# Patient Record
Sex: Male | Born: 1960
Health system: Southern US, Community
[De-identification: ages and names within clinical notes are randomized; demographics above are authoritative.]

## PROBLEM LIST (undated history)

## (undated) DIAGNOSIS — R12 Heartburn: Secondary | ICD-10-CM

## (undated) DIAGNOSIS — N200 Calculus of kidney: Secondary | ICD-10-CM

## (undated) DIAGNOSIS — Z87442 Personal history of urinary calculi: Secondary | ICD-10-CM

## (undated) DIAGNOSIS — E785 Hyperlipidemia, unspecified: Secondary | ICD-10-CM

## (undated) DIAGNOSIS — I10 Essential (primary) hypertension: Secondary | ICD-10-CM

## (undated) DIAGNOSIS — Z803 Family history of malignant neoplasm of breast: Secondary | ICD-10-CM

## (undated) DIAGNOSIS — K4000 Bilateral inguinal hernia, with obstruction, without gangrene, not specified as recurrent: Secondary | ICD-10-CM

## (undated) HISTORY — DX: Family history of malignant neoplasm of breast: Z80.3

## (undated) HISTORY — PX: JOINT REPLACEMENT: SHX530

## (undated) HISTORY — PX: OTHER SURGICAL HISTORY: SHX169

## (undated) HISTORY — DX: Hyperlipidemia, unspecified: E78.5

## (undated) HISTORY — DX: Calculus of kidney: N20.0

## (undated) HISTORY — PX: EYE SURGERY: SHX253

## (undated) HISTORY — DX: Heartburn: R12

## (undated) HISTORY — PX: CYST REMOVAL TRUNK: SHX6283

## (undated) HISTORY — PX: TOTAL KNEE ARTHROPLASTY: SHX125

---

## 2011-10-26 ENCOUNTER — Emergency Department: Payer: Self-pay | Admitting: Emergency Medicine

## 2011-10-26 LAB — URINALYSIS, COMPLETE
Bilirubin,UR: NEGATIVE
Glucose,UR: NEGATIVE mg/dL (ref 0–75)
Ketone: NEGATIVE
RBC,UR: NONE SEEN /HPF (ref 0–5)
Specific Gravity: 1.008 (ref 1.003–1.030)
Squamous Epithelial: NONE SEEN

## 2011-10-26 LAB — CBC
HCT: 46.9 % (ref 40.0–52.0)
MCH: 30.4 pg (ref 26.0–34.0)
MCHC: 32.7 g/dL (ref 32.0–36.0)
MCV: 93 fL (ref 80–100)
RBC: 5.04 10*6/uL (ref 4.40–5.90)
RDW: 13.8 % (ref 11.5–14.5)

## 2011-10-26 LAB — BASIC METABOLIC PANEL
Anion Gap: 3 — ABNORMAL LOW (ref 7–16)
BUN: 17 mg/dL (ref 7–18)
Calcium, Total: 9.4 mg/dL (ref 8.5–10.1)
EGFR (African American): >60
EGFR (Non-African Amer.): >60
Glucose: 93 mg/dL (ref 65–99)
Osmolality: 281 (ref 275–301)

## 2012-04-20 ENCOUNTER — Emergency Department: Payer: Self-pay | Admitting: Emergency Medicine

## 2012-06-01 ENCOUNTER — Ambulatory Visit: Payer: Self-pay | Admitting: Internal Medicine

## 2012-06-06 ENCOUNTER — Ambulatory Visit: Payer: Self-pay | Admitting: Internal Medicine

## 2012-12-21 ENCOUNTER — Ambulatory Visit: Payer: Self-pay | Admitting: Specialist

## 2012-12-24 ENCOUNTER — Ambulatory Visit: Payer: Self-pay | Admitting: Orthopedic Surgery

## 2014-02-11 ENCOUNTER — Emergency Department: Payer: Self-pay | Admitting: Emergency Medicine

## 2014-02-13 LAB — BETA STREP CULTURE(ARMC)

## 2015-11-10 ENCOUNTER — Emergency Department
Admission: EM | Admit: 2015-11-10 | Discharge: 2015-11-10 | Disposition: A | Discharge status: Home / Self Care | Payer: BLUE CROSS/BLUE SHIELD | Attending: Student in an Organized Health Care Education/Training Program | Admitting: Student in an Organized Health Care Education/Training Program

## 2015-11-10 ENCOUNTER — Emergency Department: Payer: BLUE CROSS/BLUE SHIELD

## 2015-11-10 DIAGNOSIS — Z79899 Other long term (current) drug therapy: Secondary | ICD-10-CM | POA: Insufficient documentation

## 2015-11-10 DIAGNOSIS — R072 Precordial pain: Secondary | ICD-10-CM | POA: Diagnosis not present

## 2015-11-10 DIAGNOSIS — I10 Essential (primary) hypertension: Secondary | ICD-10-CM | POA: Diagnosis not present

## 2015-11-10 DIAGNOSIS — R079 Chest pain, unspecified: Secondary | ICD-10-CM

## 2015-11-10 HISTORY — DX: Essential (primary) hypertension: I10

## 2015-11-10 LAB — BASIC METABOLIC PANEL
Anion gap: 6 (ref 5–15)
BUN: 18 mg/dL (ref 6–20)
CHLORIDE: 106 mmol/L (ref 101–111)
CO2: 26 mmol/L (ref 22–32)
CREATININE: 1.12 mg/dL (ref 0.61–1.24)
Calcium: 9.6 mg/dL (ref 8.9–10.3)
GFR calc Af Amer: >60 mL/min (ref >60)
GFR calc non Af Amer: >60 mL/min (ref >60)
GLUCOSE: 116 mg/dL — AB (ref 65–99)
POTASSIUM: 3.5 mmol/L (ref 3.5–5.1)
SODIUM: 138 mmol/L (ref 135–145)

## 2015-11-10 LAB — TROPONIN I
Troponin I: <0.03 ng/mL (ref <0.03)
Troponin I: <0.03 ng/mL (ref <0.03)

## 2015-11-10 LAB — CBC
HEMATOCRIT: 44.4 % (ref 40.0–52.0)
Hemoglobin: 15.8 g/dL (ref 13.0–18.0)
MCH: 32 pg (ref 26.0–34.0)
MCHC: 35.6 g/dL (ref 32.0–36.0)
MCV: 90 fL (ref 80.0–100.0)
PLATELETS: 223 10*3/uL (ref 150–440)
RBC: 4.93 MIL/uL (ref 4.40–5.90)
RDW: 14.2 % (ref 11.5–14.5)
WBC: 8.3 10*3/uL (ref 3.8–10.6)

## 2015-11-10 LAB — FIBRIN DERIVATIVES D-DIMER (ARMC ONLY): Fibrin derivatives D-dimer (ARMC): 201 (ref 0–499)

## 2015-11-10 MED ORDER — ALBUTEROL SULFATE (2.5 MG/3ML) 0.083% IN NEBU
2.5000 mg | INHALATION_SOLUTION | Freq: Once | RESPIRATORY_TRACT | Status: AC
  Administered 2015-11-10: 2.5 mg via RESPIRATORY_TRACT
  Filled 2015-11-10: qty 3

## 2015-11-10 MED ORDER — HYDROCODONE-ACETAMINOPHEN 5-325 MG PO TABS
1.0000 | ORAL_TABLET | Freq: Once | ORAL | Status: AC
  Administered 2015-11-10: 1 via ORAL
  Filled 2015-11-10: qty 1

## 2015-11-10 MED ORDER — NAPROXEN 500 MG PO TABS: 500.0000 mg | ORAL_TABLET | Freq: Two times a day (BID) | ORAL | 0 refills | Status: DC

## 2015-11-10 MED ORDER — ALBUTEROL SULFATE HFA 108 (90 BASE) MCG/ACT IN AERS: 2.0000 | INHALATION_SPRAY | Freq: Four times a day (QID) | RESPIRATORY_TRACT | 2 refills | Status: DC | PRN

## 2015-11-10 NOTE — ED Provider Notes (Signed)
[facility] Emergency Department Provider Note    None    (approximate)  I have reviewed the triage vital signs and the nursing notes.   HISTORY  Chief Complaint Chest Pain    HPI Stanley Whitney is a 55 y.o. male with history of high blood pressure presents to emergency Department with 5 days of intermittent chest pain and shortness of breath. Patient denies any history of coronary disease. Does have family history of heart disease. He denies any history of high cholesterol or diabetes. States that when he takes a deep breath he does have some midsternal discomfort without radiation to his shoulder or back. He currently states the pain is 2 out of 10 in severity when taking a deep breath. Denies any cough or fevers. Denies any nausea or vomiting. He does not smoke. This is fairly active at work and has had worsening muscle cramps over the past several weeks. States that his chest currently feels like a muscle cramp.   Past Medical History:  Diagnosis Date  . Hypertension     There are no active problems to display for this patient.   History reviewed. No pertinent surgical history.  Prior to Admission medications   Medication Sig Start Date End Date Taking? Authorizing Provider  atenolol (TENORMIN) 50 MG tablet Take 50 mg by mouth daily.   Yes Historical Provider, MD  esomeprazole (NEXIUM) 40 MG capsule Take 40 mg by mouth every evening.  11/07/15  Yes Historical Provider, MD  hydrochlorothiazide (HYDRODIURIL) 12.5 MG tablet Take 12.5 mg by mouth daily.  10/24/15  Yes Historical Provider, MD  naproxen sodium (ANAPROX) 220 MG tablet Take 440 mg by mouth 2 (two) times daily with a meal.   Yes Historical Provider, MD  Omega-3 Fatty Acids (FISH OIL) 1000 MG CAPS Take 2 capsules by mouth daily.   Yes Historical Provider, MD  albuterol (PROVENTIL HFA;VENTOLIN HFA) 108 (90 Base) MCG/ACT inhaler Inhale 2 puffs into the lungs every 6 (six) hours as needed for  wheezing or shortness of breath. 11/10/15   Merlyn Lot, MD  naproxen (NAPROSYN) 500 MG tablet Take 1 tablet (500 mg total) by mouth 2 (two) times daily with a meal. 11/10/15 11/09/16  Merlyn Lot, MD    Allergies Review of patient's allergies indicates no known allergies.  No family history on file.  Social History Social History  Substance Use Topics  . Smoking status: Never Smoker  . Smokeless tobacco: Never Used  . Alcohol use No    Review of Systems Patient denies headaches, rhinorrhea, blurry vision, numbness, shortness of breath, chest pain, edema, cough, abdominal pain, nausea, vomiting, diarrhea, dysuria, fevers, rashes or hallucinations unless otherwise stated above in HPI. ____________________________________________   PHYSICAL EXAM:  VITAL SIGNS: Vitals:   11/10/15 1530 11/10/15 1600  BP: 124/86 (!) 144/92  Pulse: 66 66  Resp: 12 13  Temp:      Constitutional: Alert and oriented. Well appearing and in no acute distress. Eyes: Conjunctivae are normal. PERRL. EOMI. Head: Atraumatic. Nose: No congestion/rhinnorhea. Mouth/Throat: Mucous membranes are moist.  Oropharynx non-erythematous. Neck: No stridor. Painless ROM. No cervical spine tenderness to palpation Hematological/Lymphatic/Immunilogical: No cervical lymphadenopathy. Cardiovascular: Normal rate, regular rhythm. Grossly normal heart sounds.  Good peripheral circulation. Respiratory: Normal respiratory effort.  No retractions. Lungs CTAB. Gastrointestinal: Soft and nontender. No distention. No abdominal bruits. No CVA tenderness. Genitourinary:  Musculoskeletal: No lower extremity tenderness nor edema.  No joint effusions. Neurologic:  Normal speech and language. No  gross focal neurologic deficits are appreciated. No gait instability. Skin:  Skin is warm, dry and intact. No rash noted. Psychiatric: Mood and affect are normal. Speech and behavior are  normal.  ____________________________________________   LABS (all labs ordered are listed, but only abnormal results are displayed)  Results for orders placed or performed during the hospital encounter of 11/10/15 (from the past 24 hour(s))  Basic metabolic panel     Status: Abnormal   Collection Time: 11/10/15 10:38 AM  Result Value Ref Range   Sodium 138 135 - 145 mmol/L   Potassium 3.5 3.5 - 5.1 mmol/L   Chloride 106 101 - 111 mmol/L   CO2 26 22 - 32 mmol/L   Glucose, Bld 116 (H) 65 - 99 mg/dL   BUN 18 6 - 20 mg/dL   Creatinine, Ser 1.12 0.61 - 1.24 mg/dL   Calcium 9.6 8.9 - 10.3 mg/dL   GFR calc non Af Amer >60 >60 mL/min   GFR calc Af Amer >60 >60 mL/min   Anion gap 6 5 - 15  CBC     Status: None   Collection Time: 11/10/15 10:38 AM  Result Value Ref Range   WBC 8.3 3.8 - 10.6 K/uL   RBC 4.93 4.40 - 5.90 MIL/uL   Hemoglobin 15.8 13.0 - 18.0 g/dL   HCT 44.4 40.0 - 52.0 %   MCV 90.0 80.0 - 100.0 fL   MCH 32.0 26.0 - 34.0 pg   MCHC 35.6 32.0 - 36.0 g/dL   RDW 14.2 11.5 - 14.5 %   Platelets 223 150 - 440 K/uL  Troponin I     Status: None   Collection Time: 11/10/15 10:38 AM  Result Value Ref Range   Troponin I <0.03 <0.03 ng/mL  Fibrin derivatives D-Dimer ([facility] only)     Status: None   Collection Time: 11/10/15  3:13 PM  Result Value Ref Range   Fibrin derivatives D-dimer ([facility]) 201 0 - 499  Troponin I     Status: None   Collection Time: 11/10/15  3:13 PM  Result Value Ref Range   Troponin I <0.03 <0.03 ng/mL   ____________________________________________  EKG  Time: 10:30  Indication: chest pain  Rate: 75  Rhythm: NSR Axis: normal Other: poor r wave progression, no acute ischemic changes ____________________________________________  RADIOLOGY  CXR  my read shows no evidence of acute cardiopulmonary process.  ____________________________________________   PROCEDURES  Procedure(s) performed: none    Critical Care performed:  no ____________________________________________   INITIAL IMPRESSION / ASSESSMENT AND PLAN / ED COURSE  Pertinent labs & imaging results that were available during my care of the patient were reviewed by me and considered in my medical decision making (see chart for details).  DDX: ACS, PE, dissection, pericarditis, costochondritis, pleurisy, bronchitis, pneumonia  Stanley Whitney is a 55 y.o. who presents to the ED with 5 days of intermittent chest pain and shortness of breath. Initial EKG and troponin performed in triage and negative for acute ischemia. Presentation is not consistent with dissection as he is equal pulses bilaterally no tearing pain through to his chest. Chest x-ray ordered due to concern for heart failure or pneumonia shows none. Pain is reproducible with palpation. Less consistent with pericarditis. Will provide albuterol treatment for possible bronchitis.  Patient has a low risk heart score of 3. Based on his pleuritic chest pain or d-dimer to further risk stratify for PE as he is otherwise low risk [place].  Clinical Course  Comment By  Time  Patient reassessed and currently chest pain-free. Results of testing and that we are still awaiting results of d-dimer. Merlyn Lot, MD 08/08 1615  D-dimer is negative do not feel this presentation clinically consistent with PE.  Patient is well-appearing chest pain-free. The patient is stable for outpatient follow-up with PCP.Marland Kitchen Merlyn Lot, MD 08/08 [phone]    Have discussed with the patient and available family all diagnostics and treatments performed thus far and all questions were answered to the best of my ability. The patient demonstrates understanding and agreement with plan.  ____________________________________________   FINAL CLINICAL IMPRESSION(S) / ED DIAGNOSES  Final diagnoses:  Chest pain, unspecified chest pain type      NEW MEDICATIONS STARTED DURING THIS VISIT:  New Prescriptions   ALBUTEROL (PROVENTIL  HFA;VENTOLIN HFA) 108 (90 BASE) MCG/ACT INHALER    Inhale 2 puffs into the lungs every 6 (six) hours as needed for wheezing or shortness of breath.   NAPROXEN (NAPROSYN) 500 MG TABLET    Take 1 tablet (500 mg total) by mouth 2 (two) times daily with a meal.     Note:  This document was prepared using Dragon voice recognition software and may include unintentional dictation errors.    Merlyn Lot, MD 11/10/15 [phone]

## 2015-11-10 NOTE — ED Triage Notes (Signed)
Pt c/o intermittent substernal  Chest pain for the past 4-5 days with muscle cramping with visual changes.the patient is in NAD at present.Stanley Whitney

## 2016-01-04 ENCOUNTER — Ambulatory Visit (INDEPENDENT_AMBULATORY_CARE_PROVIDER_SITE_OTHER): Payer: BLUE CROSS/BLUE SHIELD | Admitting: Urology

## 2016-01-04 ENCOUNTER — Encounter: Payer: Self-pay | Admitting: Urology

## 2016-01-04 ENCOUNTER — Other Ambulatory Visit: Payer: Self-pay

## 2016-01-04 VITALS — BP 150/89 | HR 70 | Ht 68.0 in | Wt 201.8 lb

## 2016-01-04 DIAGNOSIS — K439 Ventral hernia without obstruction or gangrene: Secondary | ICD-10-CM | POA: Diagnosis not present

## 2016-01-04 DIAGNOSIS — R8290 Unspecified abnormal findings in urine: Secondary | ICD-10-CM

## 2016-01-04 DIAGNOSIS — R3129 Other microscopic hematuria: Secondary | ICD-10-CM

## 2016-01-04 LAB — BLADDER SCAN AMB NON-IMAGING: Scan Result: 15

## 2016-01-04 NOTE — Progress Notes (Signed)
01/04/2016 4:42 PM   Stanley Whitney [birth year] [id number]  Referring provider: Jodi Marble, MD [address] [place], [place] [address]  Chief Complaint  Patient presents with  . Hematuria    referred by S. Ahmed Elijio Miles    HPI: Patient is a 55 -year-old Caucasian male who presents today as a referral from their PCP, Dr. Elijio Miles, for microscopic hematuria.  Patient was found to have trace blood on a POCT urine dip on 12/14/2015.  Patient doesn't have a prior history of microscopic hematuria.    He does have a possible history of nephrolithiasis, but he does not have a prior history of recurrent urinary tract infections, trauma to the genitourinary tract, BPH or malignancies of the genitourinary tract.   He does not have a family medical history of nephrolithiasis, malignancies of the genitourinary tract or hematuria.   Today, he is not having symptoms of frequent urination, urgency, dysuria, nocturia, incontinence, hesitancy, intermittency, straining to urinate or a weak urinary stream.  His UA today does not demonstrate microscopic hematuria.    He is experiencing abdominal pain due to two ventral hernias.  Patient is under the impression that he is here for the evaluation of the hernias.    He is not experiencing any suprapubic pain or flank pain. He denies any recent fevers, chills, nausea or vomiting.  His PVR today is 15 mL.    He is a former smoker, with a 1 ppd history.  Quit 30 years ago.  He does dip.  He is not exposed to secondhand smoke.  He has worked with [employer].    PMH: Past Medical History:  Diagnosis Date  . Heartburn   . HLD (hyperlipidemia)   . Hypertension   . Kidney stone     Surgical History: Past Surgical History:  Procedure Laterality Date  . none      Home Medications:    Medication List       Accurate as of 01/04/16  4:42 PM. Always use your most recent med list.          albuterol 108 (90 Base) MCG/ACT  inhaler Commonly known as:  PROVENTIL HFA;VENTOLIN HFA Inhale 2 puffs into the lungs every 6 (six) hours as needed for wheezing or shortness of breath.   atenolol 50 MG tablet Commonly known as:  TENORMIN Take 50 mg by mouth daily.   atenolol-chlorthalidone 50-25 MG tablet Commonly known as:  TENORETIC   DOCOSAHEXAENOIC ACID PO Take by mouth.   esomeprazole 40 MG capsule Commonly known as:  NEXIUM Take 40 mg by mouth every evening.   Fish Oil 1000 MG Caps Take 2 capsules by mouth daily.   hydrochlorothiazide 12.5 MG tablet Commonly known as:  HYDRODIURIL Take 12.5 mg by mouth daily.   losartan 25 MG tablet Commonly known as:  COZAAR Take 25 mg by mouth.   naproxen 500 MG tablet Commonly known as:  NAPROSYN Take 1 tablet (500 mg total) by mouth 2 (two) times daily with a meal.   naproxen sodium 220 MG tablet Commonly known as:  ANAPROX Take 440 mg by mouth 2 (two) times daily with a meal.   rosuvastatin 5 MG tablet Commonly known as:  CRESTOR Take 5 mg by mouth daily.       Allergies: No Known Allergies  Family History: Family History  Problem Relation Age of Onset  . Kidney disease Neg Hx   . Prostate cancer Neg Hx     Social History:  reports that he has quit smoking. He has never used smokeless tobacco. He reports that he drinks alcohol. He reports that he does not use drugs.  ROS: UROLOGY Frequent Urination?: No Hard to postpone urination?: No Burning/pain with urination?: No Get up at night to urinate?: No Leakage of urine?: No Urine stream starts and stops?: No Trouble starting stream?: No Do you have to strain to urinate?: No Blood in urine?: No Urinary tract infection?: No Sexually transmitted disease?: No Injury to kidneys or bladder?: No Painful intercourse?: No Weak stream?: No Erection problems?: Yes Penile pain?: No  Gastrointestinal Nausea?: No Vomiting?: No Indigestion/heartburn?: No Diarrhea?: No Constipation?:  No  Constitutional Fever: No Night sweats?: No Weight loss?: No Fatigue?: No  Skin Skin rash/lesions?: No Itching?: No  Eyes Blurred vision?: No Double vision?: No  Ears/Nose/Throat Sore throat?: No Sinus problems?: No  Hematologic/Lymphatic Swollen glands?: No Easy bruising?: No  Cardiovascular Leg swelling?: No Chest pain?: No  Respiratory Cough?: No Shortness of breath?: No  Endocrine Excessive thirst?: No  Musculoskeletal Back pain?: Yes Joint pain?: Yes  Neurological Headaches?: No Dizziness?: No  Psychologic Depression?: No Anxiety?: No  Physical Exam: BP (!) 150/89   Pulse 70   Ht 5\' 8"  (1.727 m)   Wt 201 lb 12.8 oz (91.5 kg)   BMI 30.68 kg/m   Constitutional: Well nourished. Alert and oriented, No acute distress. HEENT: [place] AT, moist mucus membranes. Trachea midline, no masses. Cardiovascular: No clubbing, cyanosis, or edema. Respiratory: Normal respiratory effort, no increased work of breathing. GI: Abdomen is soft, non tender, non distended, no abdominal masses. Liver and spleen not palpable.  No hernias appreciated.  Stool sample for occult testing is not indicated.   GU: No CVA tenderness.  No bladder fullness or masses.  Patient with circumcised phallus.  Urethral meatus is patent.  No penile discharge. No penile lesions or rashes. Scrotum without lesions, cysts, rashes and/or edema.  Testicles are located scrotally bilaterally. No masses are appreciated in the testicles. Left and right epididymis are normal. Rectal: Patient with  normal sphincter tone. Anus and perineum without scarring or rashes. No rectal masses are appreciated. Prostate is approximately 50 grams, no nodules are appreciated. Seminal vesicles are normal. Skin: No rashes, bruises or suspicious lesions. Lymph: No cervical or inguinal adenopathy. Neurologic: Grossly intact, no focal deficits, moving all 4 extremities. Psychiatric: Normal mood and affect.  Laboratory  Data: Lab Results  Component Value Date   WBC 8.3 11/10/2015   HGB 15.8 11/10/2015   HCT 44.4 11/10/2015   MCV 90.0 11/10/2015   PLT 223 11/10/2015    Lab Results  Component Value Date   CREATININE 1.12 11/10/2015   PSA History  1.0 ng/mL on 12/14/2015  Urinalysis Unremarkable.  See EPIC.  Pertinent Imaging: Results for ATILANO, KRANTZ (MRN [id number]) as of 01/10/2016 17:12  Ref. Range 01/04/2016 16:44  Scan Result Unknown 15    Assessment & Plan:    1. Abnormal urine dip  - At this time patient does not meet the requirements for a hematuria workup, he does not endorse gross hematuria and I do not have microscopic urinalysis that contain 3 or more red blood cells per high-powered field  - Patient will return in 2 weeks for a recheck of a microscopic UA, if 3 or more red blood cells are seen on the specimen we will pursue hematuria or workup with CT urogram and cystoscopy  - Patient will report any episodes of gross hematuria to [place]  -  Urinalysis, Complete  - CULTURE, URINE COMPREHENSIVE  - BUN+Creat  2. Ventral hernias  - I explained to the patient that ventral hernias are not at the purview of a urological specialty  - Offered the patient referral to a general surgeon, but he declines at this time   Return in about 2 weeks (around 01/18/2016) for UA only.  These notes generated with voice recognition software. I apologize for typographical errors.  Zara Council, [facility] [address], [place], [place] [address] [phone]

## 2016-01-05 LAB — URINALYSIS, COMPLETE
BILIRUBIN UA: NEGATIVE
GLUCOSE, UA: NEGATIVE
Ketones, UA: NEGATIVE
Leukocytes, UA: NEGATIVE
Nitrite, UA: NEGATIVE
PROTEIN UA: NEGATIVE
Specific Gravity, UA: 1.02 (ref 1.005–1.030)
UUROB: 0.2 mg/dL (ref 0.2–1.0)
pH, UA: 5.5 (ref 5.0–7.5)

## 2016-01-05 LAB — MICROSCOPIC EXAMINATION
Bacteria, UA: NONE SEEN
Epithelial Cells (non renal): NONE SEEN /hpf (ref 0–10)
WBC UA: NONE SEEN /HPF (ref 0–?)

## 2016-01-18 ENCOUNTER — Other Ambulatory Visit: Payer: BLUE CROSS/BLUE SHIELD

## 2016-01-18 ENCOUNTER — Other Ambulatory Visit: Payer: Self-pay

## 2016-01-18 DIAGNOSIS — R3129 Other microscopic hematuria: Secondary | ICD-10-CM

## 2016-01-18 LAB — MICROSCOPIC EXAMINATION
Bacteria, UA: NONE SEEN
EPITHELIAL CELLS (NON RENAL): NONE SEEN /HPF (ref 0–10)

## 2016-01-18 LAB — URINALYSIS, COMPLETE
Bilirubin, UA: NEGATIVE
Glucose, UA: NEGATIVE
Ketones, UA: NEGATIVE
Leukocytes, UA: NEGATIVE
NITRITE UA: NEGATIVE
PH UA: 6.5 (ref 5.0–7.5)
Protein, UA: NEGATIVE
RBC, UA: NEGATIVE
Specific Gravity, UA: 1.01 (ref 1.005–1.030)
Urobilinogen, Ur: 0.2 mg/dL (ref 0.2–1.0)

## 2016-01-19 ENCOUNTER — Telehealth: Payer: Self-pay

## 2016-01-19 NOTE — Telephone Encounter (Signed)
-----   Message from Nori Riis, PA-C sent at 01/19/2016  8:23 AM EDT ----- No blood seen on UA.  Patient to RTC in he has gross hematuria or 3 or greater RBC's/hpf on microscopic urinalysis.

## 2016-01-19 NOTE — Telephone Encounter (Signed)
LMOM

## 2016-01-20 NOTE — Telephone Encounter (Signed)
LMOM

## 2016-01-21 ENCOUNTER — Telehealth: Payer: Self-pay

## 2016-01-21 NOTE — Telephone Encounter (Signed)
Tracking # for certified letter: [id number]

## 2016-01-21 NOTE — Telephone Encounter (Signed)
LMOM- will send a certified letter.

## 2016-02-22 DIAGNOSIS — S83249A Other tear of medial meniscus, current injury, unspecified knee, initial encounter: Secondary | ICD-10-CM | POA: Insufficient documentation

## 2016-03-14 ENCOUNTER — Other Ambulatory Visit: Payer: Self-pay | Admitting: Specialist

## 2016-03-14 DIAGNOSIS — S83221A Peripheral tear of medial meniscus, current injury, right knee, initial encounter: Secondary | ICD-10-CM

## 2016-03-25 ENCOUNTER — Ambulatory Visit: Admission: RE | Admit: 2016-03-25 | Payer: BLUE CROSS/BLUE SHIELD | Source: Ambulatory Visit

## 2016-03-31 ENCOUNTER — Telehealth: Payer: Self-pay | Admitting: Gastroenterology

## 2016-03-31 NOTE — Telephone Encounter (Signed)
Colonoscopy  h [phone] w [phone]

## 2016-04-01 NOTE — Telephone Encounter (Signed)
Pt not ready to schedule colonoscopy at this time. Pt requested a call back in mid January to schedule.

## 2016-04-19 ENCOUNTER — Ambulatory Visit: Payer: BLUE CROSS/BLUE SHIELD

## 2016-06-01 ENCOUNTER — Telehealth: Payer: Self-pay | Admitting: Gastroenterology

## 2016-06-01 NOTE — Telephone Encounter (Signed)
colonoscopy

## 2016-06-10 ENCOUNTER — Telehealth: Payer: Self-pay

## 2016-06-10 ENCOUNTER — Other Ambulatory Visit: Payer: Self-pay

## 2016-06-10 DIAGNOSIS — Z1211 Encounter for screening for malignant neoplasm of colon: Secondary | ICD-10-CM

## 2016-06-10 NOTE — Telephone Encounter (Signed)
Gastroenterology Pre-Procedure Review  Request Date: 06/16/16 Requesting Physician: Dr. Vicente Males  PATIENT REVIEW QUESTIONS: The patient responded to the following health history questions as indicated:    1. Are you having any GI issues? no 2. Do you have a personal history of Polyps? no 3. Do you have a family history of Colon Cancer or Polyps? no 4. Diabetes Mellitus? no 5. Joint replacements in the past 12 months?no 6. Major health problems in the past 3 months?no 7. Any artificial heart valves, MVP, or defibrillator?no    MEDICATIONS & ALLERGIES:    Patient reports the following regarding taking any anticoagulation/antiplatelet therapy:   Plavix, Coumadin, Eliquis, Xarelto, Lovenox, Pradaxa, Brilinta, or Effient? no Aspirin? no  Patient confirms/reports the following medications:  Current Outpatient Prescriptions  Medication Sig Dispense Refill  . albuterol (PROVENTIL HFA;VENTOLIN HFA) 108 (90 Base) MCG/ACT inhaler Inhale 2 puffs into the lungs every 6 (six) hours as needed for wheezing or shortness of breath. (Patient not taking: Reported on 01/04/2016) 1 Inhaler 2  . atenolol (TENORMIN) 50 MG tablet Take 50 mg by mouth daily.    Marland Kitchen atenolol-chlorthalidone (TENORETIC) 50-25 MG tablet     . DOCOSAHEXAENOIC ACID PO Take by mouth.    . esomeprazole (NEXIUM) 40 MG capsule Take 40 mg by mouth every evening.     . hydrochlorothiazide (HYDRODIURIL) 12.5 MG tablet Take 12.5 mg by mouth daily.     Marland Kitchen losartan (COZAAR) 25 MG tablet Take 25 mg by mouth.    . naproxen (NAPROSYN) 500 MG tablet Take 1 tablet (500 mg total) by mouth 2 (two) times daily with a meal. (Patient not taking: Reported on 01/04/2016) 20 tablet 0  . naproxen sodium (ANAPROX) 220 MG tablet Take 440 mg by mouth 2 (two) times daily with a meal.    . Omega-3 Fatty Acids (FISH OIL) 1000 MG CAPS Take 2 capsules by mouth daily.    . rosuvastatin (CRESTOR) 5 MG tablet Take 5 mg by mouth daily.     No current facility-administered  medications for this visit.     Patient confirms/reports the following allergies:  No Known Allergies  No orders of the defined types were placed in this encounter.   AUTHORIZATION INFORMATION Primary Insurance: 1D#: Group #:  Secondary Insurance: 1D#: Group #:  SCHEDULE INFORMATION: Date: 06/16/16 Time: Location: [place]

## 2016-06-14 ENCOUNTER — Other Ambulatory Visit: Payer: Self-pay

## 2016-06-14 DIAGNOSIS — Z1211 Encounter for screening for malignant neoplasm of colon: Secondary | ICD-10-CM

## 2016-06-14 MED ORDER — NA SULFATE-K SULFATE-MG SULF 17.5-3.13-1.6 GM/177ML PO SOLN: 1.0000 | Freq: Once | ORAL | 1 refills | Status: AC

## 2016-06-16 ENCOUNTER — Ambulatory Visit: Payer: BLUE CROSS/BLUE SHIELD | Admitting: Certified Registered"

## 2016-06-16 ENCOUNTER — Encounter: Payer: Self-pay | Admitting: *Deleted

## 2016-06-16 ENCOUNTER — Encounter
Admission: RE | Disposition: A | Discharge status: Home / Self Care | Payer: Self-pay | Source: Ambulatory Visit | Attending: Gastroenterology

## 2016-06-16 ENCOUNTER — Ambulatory Visit
Admission: RE | Admit: 2016-06-16 | Discharge: 2016-06-16 | Disposition: A | Discharge status: Home / Self Care | Payer: BLUE CROSS/BLUE SHIELD | Source: Ambulatory Visit | Attending: Gastroenterology | Admitting: Gastroenterology

## 2016-06-16 DIAGNOSIS — K635 Polyp of colon: Secondary | ICD-10-CM | POA: Diagnosis not present

## 2016-06-16 DIAGNOSIS — Z1211 Encounter for screening for malignant neoplasm of colon: Secondary | ICD-10-CM | POA: Diagnosis not present

## 2016-06-16 DIAGNOSIS — Z791 Long term (current) use of non-steroidal anti-inflammatories (NSAID): Secondary | ICD-10-CM | POA: Diagnosis not present

## 2016-06-16 DIAGNOSIS — E785 Hyperlipidemia, unspecified: Secondary | ICD-10-CM | POA: Insufficient documentation

## 2016-06-16 DIAGNOSIS — D125 Benign neoplasm of sigmoid colon: Secondary | ICD-10-CM | POA: Diagnosis not present

## 2016-06-16 DIAGNOSIS — I10 Essential (primary) hypertension: Secondary | ICD-10-CM | POA: Insufficient documentation

## 2016-06-16 DIAGNOSIS — Z79899 Other long term (current) drug therapy: Secondary | ICD-10-CM | POA: Diagnosis not present

## 2016-06-16 DIAGNOSIS — Z87891 Personal history of nicotine dependence: Secondary | ICD-10-CM | POA: Insufficient documentation

## 2016-06-16 DIAGNOSIS — D120 Benign neoplasm of cecum: Secondary | ICD-10-CM

## 2016-06-16 DIAGNOSIS — D122 Benign neoplasm of ascending colon: Secondary | ICD-10-CM

## 2016-06-16 DIAGNOSIS — Z7951 Long term (current) use of inhaled steroids: Secondary | ICD-10-CM | POA: Insufficient documentation

## 2016-06-16 DIAGNOSIS — Z87442 Personal history of urinary calculi: Secondary | ICD-10-CM | POA: Insufficient documentation

## 2016-06-16 DIAGNOSIS — D124 Benign neoplasm of descending colon: Secondary | ICD-10-CM | POA: Diagnosis not present

## 2016-06-16 HISTORY — DX: Bilateral inguinal hernia, with obstruction, without gangrene, not specified as recurrent: K40.00

## 2016-06-16 HISTORY — PX: COLONOSCOPY WITH PROPOFOL: SHX5780

## 2016-06-16 SURGERY — COLONOSCOPY WITH PROPOFOL
Anesthesia: General

## 2016-06-16 MED ORDER — PROPOFOL 500 MG/50ML IV EMUL
INTRAVENOUS | Status: DC | PRN
  Administered 2016-06-16: 150 ug/kg/min via INTRAVENOUS

## 2016-06-16 MED ORDER — METHYLENE BLUE 0.5 % INJ SOLN
INTRAVENOUS | Status: AC
  Filled 2016-06-16: qty 10

## 2016-06-16 MED ORDER — MIDAZOLAM HCL 2 MG/2ML IJ SOLN
INTRAMUSCULAR | Status: DC | PRN
  Administered 2016-06-16: 2 mg via INTRAVENOUS

## 2016-06-16 MED ORDER — PROPOFOL 10 MG/ML IV BOLUS
INTRAVENOUS | Status: DC | PRN
  Administered 2016-06-16: 70 mg via INTRAVENOUS

## 2016-06-16 MED ORDER — MIDAZOLAM HCL 2 MG/2ML IJ SOLN
INTRAMUSCULAR | Status: AC
  Filled 2016-06-16: qty 2

## 2016-06-16 MED ORDER — SODIUM CHLORIDE 0.9 % IV SOLN
INTRAVENOUS | Status: DC
  Administered 2016-06-16: 09:00:00 via INTRAVENOUS

## 2016-06-16 MED ORDER — STERILE WATER FOR IRRIGATION IR SOLN
Status: DC | PRN
  Administered 2016-06-16: 7 mL

## 2016-06-16 NOTE — Anesthesia Procedure Notes (Signed)
Performed by: Darl Kuss Pre-anesthesia Checklist: Patient identified, Emergency Drugs available, Suction available, Patient being monitored and Timeout performed Patient Re-evaluated:Patient Re-evaluated prior to inductionOxygen Delivery Method: Nasal cannula Preoxygenation: Pre-oxygenation with 100% oxygen Intubation Type: IV induction       

## 2016-06-16 NOTE — Op Note (Signed)
[facility] Gastroenterology Patient Name: Stanley Whitney Procedure Date: 06/16/2016 9:40 AM MRN: [id number] Account #: [id number] Date of Birth: [birth year] Admit Type: Outpatient Age: 56 Room: [facility] ENDO ROOM 4 Gender: Male Note Status: Finalized Procedure:            Colonoscopy Indications:          Screening for colorectal malignant neoplasm Providers:            [name] MD, MD Referring MD:         Venetia Maxon. Elijio Miles, MD (Referring MD) Medicines:            Monitored Anesthesia Care Complications:        No immediate complications. Procedure:            Pre-Anesthesia Assessment:                       - Prior to the procedure, a History and Physical was                        performed, and patient medications, allergies and                        sensitivities were reviewed. The patient's tolerance of                        previous anesthesia was reviewed.                       - The risks and benefits of the procedure and the                        sedation options and risks were discussed with the                        patient. All questions were answered and informed                        consent was obtained.                       - ASA Grade Assessment: II - A patient with mild                        systemic disease.                       After obtaining informed consent, the colonoscope was                        passed under direct vision. Throughout the procedure,                        the patient's blood pressure, pulse, and oxygen                        saturations were monitored continuously. The                        Colonoscope was introduced through the anus and  advanced to the the cecum, identified by the                        appendiceal orifice, IC valve and transillumination.                        The colonoscopy was somewhat difficult due to a                        tortuous colon. The patient tolerated the  procedure                        well. The quality of the bowel preparation was                        excellent. Findings:      Seven sessile polyps were found in the ascending colon. The polyps were       5 to 7 mm in size. These polyps were removed with a cold snare.       Resection and retrieval were complete.      Two sessile polyps were found in the descending colon. The polyps were 5       to 7 mm in size. These polyps were removed with a cold snare. Resection       and retrieval were complete. No biopsies or other specimens were       collected for this exam.      A 10 mm polyp was found in the sigmoid colon. The polyp was       semi-pedunculated. The polyp was removed with a hot snare. Resection and       retrieval were complete.      A 10 mm polyp was found in the cecum. The polyp was sessile. The polyp       was removed with a saline injection-lift technique using a hot snare.       The polyp was removed with a piecemeal technique using a hot snare.       Resection and retrieval were complete. To prevent bleeding after the       polypectomy, one hemostatic clip was successfully placed. There was no       bleeding at the end of the maneuver.      The exam was otherwise without abnormality on direct and retroflexion       views. Impression:           - Seven 5 to 7 mm polyps in the ascending colon,                        removed with a cold snare. Resected and retrieved.                       - Two 5 to 7 mm polyps in the descending colon, removed                        with a cold snare. Resected and retrieved. No specimens                        collected.                       - One  10 mm polyp in the sigmoid colon, removed with a                        hot snare. Resected and retrieved.                       - One 10 mm polyp in the cecum, removed using                        injection-lift and a hot snare and removed piecemeal                        using a hot snare.  Resected and retrieved.                       - The examination was otherwise normal on direct and                        retroflexion views. Recommendation:       - Discharge patient to home (with escort).                       - Resume previous diet.                       - Continue present medications.                       - Await pathology results.                       - Repeat colonoscopy in 4 months for surveillance after                        piecemeal polypectomy.                       - No ibuprofen, naproxen, or other non-steroidal                        anti-inflammatory drugs for 4 weeks after polyp removal.                       - Would benefit from genetic testing if most polyps are                        adenomatous in nature Procedure Code(s):    --- Professional ---                       [phone], Colonoscopy, flexible; with removal of tumor(s),                        polyp(s), or other lesion(s) by snare technique                       [id number], Colonoscopy, flexible; with directed submucosal                        injection(s), any substance Diagnosis Code(s):    --- Professional ---  D12.2, Benign neoplasm of ascending colon                       Z12.11, Encounter for screening for malignant neoplasm                        of colon                       D12.4, Benign neoplasm of descending colon                       D12.5, Benign neoplasm of sigmoid colon                       D12.0, Benign neoplasm of cecum CPT copyright 2016 American Medical Association. All rights reserved. The codes documented in this report are preliminary and upon coder review may  be revised to meet current compliance requirements. [name], MD [name] MD, MD 06/16/2016 10:35:01 AM This report has been signed electronically. Number of Addenda: 0 Note Initiated On: 06/16/2016 9:40 AM Scope Withdrawal Time: 0 hours 26 minutes 27 seconds  Total Procedure Duration: 0  hours 33 minutes 22 seconds       [facility]

## 2016-06-16 NOTE — Brief Op Note (Signed)
2 polyp cold snares in descending colon not retrieved.

## 2016-06-16 NOTE — Anesthesia Postprocedure Evaluation (Signed)
Anesthesia Post Note  Patient: Stanley Whitney  Procedure(s) Performed: Procedure(s) (LRB): COLONOSCOPY WITH PROPOFOL (N/A)  Patient location during evaluation: Endoscopy Anesthesia Type: General Level of consciousness: awake and alert Pain management: pain level controlled Vital Signs Assessment: post-procedure vital signs reviewed and stable Respiratory status: spontaneous breathing, nonlabored ventilation, respiratory function stable and patient connected to nasal cannula oxygen Cardiovascular status: blood pressure returned to baseline and stable Postop Assessment: no signs of nausea or vomiting Anesthetic complications: no     Last Vitals:  Vitals:   06/16/16 1050 06/16/16 1100  BP: (!) 133/93 (!) 148/96  Pulse: 71 66  Resp: 13 15  Temp:      Last Pain:  Vitals:   06/16/16 1030  TempSrc: Tympanic                 Martha Clan

## 2016-06-16 NOTE — Anesthesia Preprocedure Evaluation (Signed)
Anesthesia Evaluation  Patient identified by MRN, date of birth, ID band Patient awake    Reviewed: Allergy & Precautions, H&P , NPO status , Patient's Chart, lab work & pertinent test results, reviewed documented beta blocker date and time   History of Anesthesia Complications (+) Family history of anesthesia reaction and history of anesthetic complications  Airway Mallampati: I  TM Distance: >3 FB Neck ROM: full    Dental  (+) Caps, Missing   Pulmonary neg pulmonary ROS, former smoker,           Cardiovascular Exercise Tolerance: Good hypertension, (-) angina(-) CAD, (-) Past MI, (-) Cardiac Stents and (-) CABG (-) dysrhythmias (-) Valvular Problems/Murmurs     Neuro/Psych negative neurological ROS  negative psych ROS   GI/Hepatic Neg liver ROS, GERD  ,  Endo/Other  negative endocrine ROS  Renal/GU Renal disease (kidney stone)  negative genitourinary   Musculoskeletal   Abdominal   Peds  Hematology negative hematology ROS (+)   Anesthesia Other Findings Past Medical History: No date: Bilateral incarcerated inguinal hernia No date: Heartburn No date: HLD (hyperlipidemia) No date: Hypertension No date: Kidney stone   Reproductive/Obstetrics negative OB ROS                             Anesthesia Physical Anesthesia Plan  ASA: II  Anesthesia Plan: General   Post-op Pain Management:    Induction:   Airway Management Planned:   Additional Equipment:   Intra-op Plan:   Post-operative Plan:   Informed Consent: I have reviewed the patients History and Physical, chart, labs and discussed the procedure including the risks, benefits and alternatives for the proposed anesthesia with the patient or authorized representative who has indicated his/her understanding and acceptance.   Dental Advisory Given  Plan Discussed with: Anesthesiologist, CRNA and Surgeon  Anesthesia Plan  Comments:         Anesthesia Quick Evaluation

## 2016-06-16 NOTE — Transfer of Care (Signed)
Immediate Anesthesia Transfer of Care Note  Patient: Stanley Whitney  Procedure(s) Performed: Procedure(s): COLONOSCOPY WITH PROPOFOL (N/A)  Patient Location: PACU  Anesthesia Type:General  Level of Consciousness: sedated  Airway & Oxygen Therapy: Patient Spontanous Breathing and Patient connected to nasal cannula oxygen  Post-op Assessment: Report given to RN and Post -op Vital signs reviewed and stable  Post vital signs: Reviewed and stable  Last Vitals:  Vitals:   06/16/16 0858 06/16/16 1033  BP: (!) 143/97 128/73  Pulse: 74 75  Resp: 20 10  Temp: (!) 35.7 C     Last Pain:  Vitals:   06/16/16 0858  TempSrc: Tympanic         Complications: No apparent anesthesia complications

## 2016-06-16 NOTE — H&P (Signed)
[name] MD [address] [place], [place] [address] Phone: [phone] Fax : [phone]  Primary Care Physician:  Volanda Napoleon, MD Primary Gastroenterologist:  Dr. [name]   Pre-Procedure History & Physical: HPI:  Stanley Whitney is a 56 y.o. male is here for an colonoscopy.   Past Medical History:  Diagnosis Date  . Bilateral incarcerated inguinal hernia   . Heartburn   . HLD (hyperlipidemia)   . Hypertension   . Kidney stone     Past Surgical History:  Procedure Laterality Date  . none      Prior to Admission medications   Medication Sig Start Date End Date Taking? Authorizing Provider  atenolol (TENORMIN) 50 MG tablet Take 50 mg by mouth daily.   Yes Historical Provider, MD  atenolol-chlorthalidone (TENORETIC) 50-25 MG tablet  06/20/14  Yes Historical Provider, MD  esomeprazole (NEXIUM) 40 MG capsule Take 40 mg by mouth every evening.  11/07/15  Yes Historical Provider, MD  hydrochlorothiazide (HYDRODIURIL) 12.5 MG tablet Take 12.5 mg by mouth daily.  10/24/15  Yes Historical Provider, MD  HYDROMET 5-1.5 MG/5ML syrup TK 5 ML PO QHS PRN 05/04/16  Yes Historical Provider, MD  losartan (COZAAR) 25 MG tablet Take 25 mg by mouth.   Yes Historical Provider, MD  naproxen (NAPROSYN) 500 MG tablet Take 1 tablet (500 mg total) by mouth 2 (two) times daily with a meal. 11/10/15 11/09/16 Yes Merlyn Lot, MD  Omega-3 Fatty Acids (FISH OIL) 1000 MG CAPS Take 2 capsules by mouth daily.   Yes Historical Provider, MD  rosuvastatin (CRESTOR) 5 MG tablet Take 5 mg by mouth daily.   Yes Historical Provider, MD  albuterol (PROVENTIL HFA;VENTOLIN HFA) 108 (90 Base) MCG/ACT inhaler Inhale 2 puffs into the lungs every 6 (six) hours as needed for wheezing or shortness of breath. Patient not taking: Reported on 01/04/2016 11/10/15   Merlyn Lot, MD  azithromycin [facility]) 250 MG tablet  05/04/16   Historical Provider, MD  DOCOSAHEXAENOIC ACID PO Take by mouth.     Historical Provider, MD  naproxen sodium (ANAPROX) 220 MG tablet Take 440 mg by mouth 2 (two) times daily with a meal.    Historical Provider, MD  oseltamivir (TAMIFLU) 75 MG capsule TK ONE C PO BID 05/04/16   Historical Provider, MD    Allergies as of 06/10/2016  . (No Known Allergies)    Family History  Problem Relation Age of Onset  . Kidney disease Neg Hx   . Prostate cancer Neg Hx     Social History   Social History  . Marital status: Married    Spouse name: N/A  . Number of children: N/A  . Years of education: N/A   Occupational History  . Not on file.   Social History Main Topics  . Smoking status: Former Research scientist (life sciences)  . Smokeless tobacco: Never Used     Comment: quit over 30 years  . Alcohol use Yes     Comment: social  . Drug use: No  . Sexual activity: Not on file   Other Topics Concern  . Not on file   Social History Narrative  . No narrative on file    Review of Systems: See HPI, otherwise negative ROS  Physical Exam: BP (!) 143/97   Pulse 74   Temp (!) 96.2 F (35.7 C) (Tympanic)   Resp 20   Ht 5\' 8"  (1.727 m)   Wt 198 lb (89.8 kg)   SpO2 100%  BMI 30.11 kg/m  General:   Alert,  pleasant and cooperative in NAD Head:  Normocephalic and atraumatic. Neck:  Supple; no masses or thyromegaly. Lungs:  Clear throughout to auscultation.    Heart:  Regular rate and rhythm. Abdomen:  Soft, nontender and nondistended. Normal bowel sounds, without guarding, and without rebound.   Neurologic:  Alert and  oriented x4;  grossly normal neurologically.  Impression/Plan: Stanley Whitney is here for an colonoscopy to be performed for Screening colonoscopy average risk    Risks, benefits, limitations, and alternatives regarding  colonoscopy have been reviewed with the patient.  Questions have been answered.  All parties agreeable.   [name], MD  06/16/2016, 9:42 AM

## 2016-06-16 NOTE — Anesthesia Post-op Follow-up Note (Cosign Needed)
Anesthesia QCDR form completed.        

## 2016-06-17 ENCOUNTER — Encounter: Payer: Self-pay | Admitting: Gastroenterology

## 2016-06-17 ENCOUNTER — Other Ambulatory Visit: Payer: Self-pay

## 2016-06-17 DIAGNOSIS — Z86010 Personal history of colonic polyps: Secondary | ICD-10-CM

## 2016-06-17 LAB — SURGICAL PATHOLOGY

## 2016-06-17 NOTE — Progress Notes (Signed)
Spoke with patient. Scheduled follow-up colonoscopy.

## 2016-06-28 ENCOUNTER — Telehealth: Payer: Self-pay | Admitting: Gastroenterology

## 2016-06-28 NOTE — Telephone Encounter (Signed)
LVM for patient callback for colonoscopy results.

## 2016-06-28 NOTE — Telephone Encounter (Signed)
Patient calling wanting colonoscopy results.

## 2016-07-18 ENCOUNTER — Encounter: Payer: Self-pay | Admitting: General Surgery

## 2016-07-18 ENCOUNTER — Ambulatory Visit (INDEPENDENT_AMBULATORY_CARE_PROVIDER_SITE_OTHER): Payer: BLUE CROSS/BLUE SHIELD | Admitting: General Surgery

## 2016-07-18 VITALS — BP 130/74 | HR 78 | Resp 12 | Ht 69.0 in | Wt 205.0 lb

## 2016-07-18 DIAGNOSIS — L723 Sebaceous cyst: Secondary | ICD-10-CM

## 2016-07-18 MED ORDER — DOXYCYCLINE HYCLATE 100 MG PO TABS: 100.0000 mg | ORAL_TABLET | Freq: Two times a day (BID) | ORAL | 0 refills | Status: DC

## 2016-07-18 NOTE — Patient Instructions (Addendum)
  Patient to return on Wednesday 07/20/16 for excision of sebaceous cyst on mid back. Instructed patient to start taking doxycyline today, twice a day.

## 2016-07-18 NOTE — Progress Notes (Signed)
Patient ID: Stanley Whitney, male   DOB: 19-Oct-1960, 56 y.o.   MRN: [id number]  Chief Complaint  Patient presents with  . Mass    HPI Stanley Whitney is a 56 y.o. male here today for a evaluation of a mass on his middle back. He states the area has been there for four years, but recently got much bigger. Denies pain, no drainage.  HPI  Past Medical History:  Diagnosis Date  . Bilateral incarcerated inguinal hernia   . Heartburn   . HLD (hyperlipidemia)   . Hypertension   . Kidney stone     Past Surgical History:  Procedure Laterality Date  . COLONOSCOPY WITH PROPOFOL N/A 06/16/2016   Procedure: COLONOSCOPY WITH PROPOFOL;  Surgeon: [name], MD;  Location: [facility] ENDOSCOPY;  Service: Endoscopy;  Laterality: N/A;  . none      Family History  Problem Relation Age of Onset  . Kidney disease Neg Hx   . Prostate cancer Neg Hx     Social History Social History  Substance Use Topics  . Smoking status: Former Research scientist (life sciences)  . Smokeless tobacco: Never Used     Comment: quit over 30 years  . Alcohol use Yes     Comment: social    No Known Allergies  Current Outpatient Prescriptions  Medication Sig Dispense Refill  . atenolol (TENORMIN) 50 MG tablet Take 50 mg by mouth daily.    Marland Kitchen esomeprazole (NEXIUM) 40 MG capsule Take 40 mg by mouth every evening.     . hydrochlorothiazide (HYDRODIURIL) 12.5 MG tablet Take 12.5 mg by mouth daily.     . Omega-3 Fatty Acids (FISH OIL) 1000 MG CAPS Take 2 capsules by mouth daily.    . rosuvastatin (CRESTOR) 5 MG tablet Take 5 mg by mouth daily.    Marland Kitchen doxycycline (VIBRA-TABS) 100 MG tablet Take 1 tablet (100 mg total) by mouth 2 (two) times daily. Started two or three days before procedure 14 tablet 0   No current facility-administered medications for this visit.     Review of Systems Review of Systems  Constitutional: Negative.   Respiratory: Negative.   Cardiovascular: Negative.     Blood pressure 130/74, pulse 78, resp. rate 12, height 5\' 9"   (1.753 m), weight 205 lb (93 kg).  Physical Exam Physical Exam  Constitutional: He is oriented to person, place, and time. He appears well-developed and well-nourished.  Cardiovascular: Normal rate, regular rhythm and normal heart sounds.   Pulmonary/Chest: Effort normal and breath sounds normal.  Neurological: He is alert and oriented to person, place, and time.  Skin: Skin is warm and dry.       Data Reviewed Medical history reviewed.   Assessment     Multiple sebaceous cysts on back. Most are small and do not need attention. The large cyst that patient is complaining about is possibly inflamed and needs to be excised. Prescribed Doxycycline 100mg  BID x 7 days, to be started today.     Plan    Patient to return on Wednesday 07/20/16 for excision of sebaceous cyst on mid back. Instructed patient to start taking doxycyline today, twice a day. Patient advised fully on the procedure and was agreeable.      HPI, Physical Exam, Assessment and Plan have been scribed under the direction and in the presence of Mckinley Jewel, MD  Stanley Whitney, CMA  I have completed the exam and reviewed the above documentation for accuracy and completeness.  I agree with the above.  Haematologist has been used and any errors in dictation or transcription are unintentional.  Jayleigh Notarianni G. Jamal Collin, M.D., F.A.C.S.  Junie Panning G 07/18/2016, 4:36 PM

## 2016-07-20 ENCOUNTER — Encounter: Payer: Self-pay | Admitting: General Surgery

## 2016-07-20 ENCOUNTER — Ambulatory Visit (INDEPENDENT_AMBULATORY_CARE_PROVIDER_SITE_OTHER): Payer: BLUE CROSS/BLUE SHIELD | Admitting: General Surgery

## 2016-07-20 VITALS — BP 130/76 | HR 72 | Resp 14 | Ht 68.0 in | Wt 203.0 lb

## 2016-07-20 DIAGNOSIS — L723 Sebaceous cyst: Secondary | ICD-10-CM

## 2016-07-20 NOTE — Progress Notes (Signed)
Patient ID: Stanley Whitney, male   DOB: [birth year], 56 y.o.   MRN: [id number]  Chief Complaint  Patient presents with  . Procedure    excision back cyst     HPI Stanley Whitney is a 56 y.o. male.  Here today for excision of a large cyst on his back HPI  Past Medical History:  Diagnosis Date  . Bilateral incarcerated inguinal hernia   . Heartburn   . HLD (hyperlipidemia)   . Hypertension   . Kidney stone     Past Surgical History:  Procedure Laterality Date  . COLONOSCOPY WITH PROPOFOL N/A 06/16/2016   Procedure: COLONOSCOPY WITH PROPOFOL;  Surgeon: [name], MD;  Location: [facility] ENDOSCOPY;  Service: Endoscopy;  Laterality: N/A;  . none      Family History  Problem Relation Age of Onset  . Kidney disease Neg Hx   . Prostate cancer Neg Hx     Social History Social History  Substance Use Topics  . Smoking status: Former Research scientist (life sciences)  . Smokeless tobacco: Never Used     Comment: quit over 30 years  . Alcohol use Yes     Comment: social    No Known Allergies  Current Outpatient Prescriptions  Medication Sig Dispense Refill  . atenolol (TENORMIN) 50 MG tablet Take 50 mg by mouth daily.    Marland Kitchen doxycycline (VIBRA-TABS) 100 MG tablet Take 1 tablet (100 mg total) by mouth 2 (two) times daily. Started two or three days before procedure 14 tablet 0  . esomeprazole (NEXIUM) 40 MG capsule Take 40 mg by mouth every evening.     . hydrochlorothiazide (HYDRODIURIL) 12.5 MG tablet Take 12.5 mg by mouth daily.     . Omega-3 Fatty Acids (FISH OIL) 1000 MG CAPS Take 2 capsules by mouth daily.    . rosuvastatin (CRESTOR) 5 MG tablet Take 5 mg by mouth daily.     No current facility-administered medications for this visit.     Review of Systems Review of Systems  Constitutional: Negative.   Respiratory: Negative.   Cardiovascular: Negative.     Blood pressure 130/76, pulse 72, resp. rate 14, height 5\' 8"  (1.727 m), weight 203 lb (92.1 kg).  Physical Exam Physical Exam   Constitutional: He is oriented to person, place, and time. He appears well-developed and well-nourished.  Neurological: He is alert and oriented to person, place, and time.  Skin: Skin is warm and dry.  Psychiatric: His behavior is normal.  3-4cmm cyst to left of spine mid back  Data Reviewed Prior note  Assessment    Sebaceous cyst large and symptomatic on his back.    Plan    Excision of the cyst performed as scheduled  Procedure: Excision large sebaceous cyst mid back with intermediate closure of the defect  Anesthetic: 10 mL of 0.5% Marcaine and 5 mL 1% Xylocaine with epi and 5 mL 1% Xylocaine plain  Prep: ChloraPrep  Description: After local anesthetic and prepping the area   was covered with sterile towels. An vertically oriented elliptical excision approximately 4.5 cm in total length was mapped out and. Incision made. The incision was then deepened carefully through the skin down to the subcutaneous tissue. And with the the use with thermal cautery and sharp dissection the cyst was removed along with the attached skin for the deep subcutaneous plane. Hemostasis was further ensured with the thermal cautery. The deep tissues were closed with interrupted 3-0 Vicryl. The skin was closed with interrupted the vertical stitches  of 3-0 nylon. Neosporin ointment Telfa small gauze and Tegaderm dressing placed. Procedure was well-tolerated with no immediate problems encountered. Wound care instructions were given to the patient He is to return in about 10-14 days for removal of the stitches     Return for suture removal in 10-14 days   HPI, Physical Exam, Assessment and Plan have been scribed under the direction and in the presence of Mckinley Jewel, MD  Stanley Fetch, RN I have completed the exam and reviewed the above documentation for accuracy and completeness.  I agree with the above.  Haematologist has been used and any errors in dictation or transcription are  unintentional.  Rasmus Preusser G. Jamal Collin, M.D., F.A.C.S.  Junie Panning G 07/22/2016, 9:50 AM

## 2016-07-20 NOTE — Patient Instructions (Addendum)
The patient is aware to call back for any questions or concerns. May shower May use ice pack as needed for comfort Return for suture removal in 10-14 days

## 2016-07-26 ENCOUNTER — Telehealth: Payer: Self-pay | Admitting: *Deleted

## 2016-07-26 NOTE — Telephone Encounter (Signed)
-----   Message from Christene Lye, MD sent at 07/25/2016  8:34 AM EDT ----- Inform pt- pathology showed a benign cyst

## 2016-07-26 NOTE — Telephone Encounter (Signed)
Notified patient as instructed, patient pleased. Discussed follow-up appointments, patient agrees  

## 2016-08-03 ENCOUNTER — Ambulatory Visit (INDEPENDENT_AMBULATORY_CARE_PROVIDER_SITE_OTHER): Payer: BLUE CROSS/BLUE SHIELD | Admitting: *Deleted

## 2016-08-03 DIAGNOSIS — L723 Sebaceous cyst: Secondary | ICD-10-CM

## 2016-08-03 NOTE — Patient Instructions (Signed)
Return as needed

## 2016-08-03 NOTE — Progress Notes (Signed)
The sutures were removed and steri strips applied. 

## 2016-10-03 ENCOUNTER — Encounter: Payer: Self-pay | Admitting: *Deleted

## 2016-10-04 ENCOUNTER — Ambulatory Visit
Admission: RE | Admit: 2016-10-04 | Discharge: 2016-10-04 | Disposition: A | Discharge status: Home / Self Care | Payer: BLUE CROSS/BLUE SHIELD | Source: Ambulatory Visit | Attending: Gastroenterology | Admitting: Gastroenterology

## 2016-10-04 ENCOUNTER — Encounter
Admission: RE | Disposition: A | Discharge status: Home / Self Care | Payer: Self-pay | Source: Ambulatory Visit | Attending: Gastroenterology

## 2016-10-04 ENCOUNTER — Ambulatory Visit: Payer: BLUE CROSS/BLUE SHIELD | Admitting: Anesthesiology

## 2016-10-04 ENCOUNTER — Encounter: Payer: Self-pay | Admitting: *Deleted

## 2016-10-04 DIAGNOSIS — E785 Hyperlipidemia, unspecified: Secondary | ICD-10-CM | POA: Insufficient documentation

## 2016-10-04 DIAGNOSIS — Z87891 Personal history of nicotine dependence: Secondary | ICD-10-CM | POA: Diagnosis not present

## 2016-10-04 DIAGNOSIS — Z1211 Encounter for screening for malignant neoplasm of colon: Secondary | ICD-10-CM | POA: Diagnosis not present

## 2016-10-04 DIAGNOSIS — Z87442 Personal history of urinary calculi: Secondary | ICD-10-CM | POA: Insufficient documentation

## 2016-10-04 DIAGNOSIS — D120 Benign neoplasm of cecum: Secondary | ICD-10-CM | POA: Diagnosis not present

## 2016-10-04 DIAGNOSIS — I10 Essential (primary) hypertension: Secondary | ICD-10-CM | POA: Insufficient documentation

## 2016-10-04 DIAGNOSIS — Z79899 Other long term (current) drug therapy: Secondary | ICD-10-CM | POA: Diagnosis not present

## 2016-10-04 DIAGNOSIS — K640 First degree hemorrhoids: Secondary | ICD-10-CM | POA: Diagnosis not present

## 2016-10-04 DIAGNOSIS — K621 Rectal polyp: Secondary | ICD-10-CM | POA: Insufficient documentation

## 2016-10-04 DIAGNOSIS — Z86010 Personal history of colonic polyps: Secondary | ICD-10-CM | POA: Insufficient documentation

## 2016-10-04 DIAGNOSIS — D122 Benign neoplasm of ascending colon: Secondary | ICD-10-CM | POA: Insufficient documentation

## 2016-10-04 HISTORY — PX: COLONOSCOPY WITH PROPOFOL: SHX5780

## 2016-10-04 SURGERY — COLONOSCOPY WITH PROPOFOL
Anesthesia: General

## 2016-10-04 MED ORDER — PROPOFOL 10 MG/ML IV BOLUS
INTRAVENOUS | Status: DC | PRN
  Administered 2016-10-04: 90 mg via INTRAVENOUS

## 2016-10-04 MED ORDER — PROPOFOL 500 MG/50ML IV EMUL
INTRAVENOUS | Status: DC | PRN
  Administered 2016-10-04: 150 ug/kg/min via INTRAVENOUS

## 2016-10-04 MED ORDER — SODIUM CHLORIDE 0.9 % IV SOLN
INTRAVENOUS | Status: DC
  Administered 2016-10-04 (×2): via INTRAVENOUS

## 2016-10-04 MED ORDER — PROPOFOL 500 MG/50ML IV EMUL
INTRAVENOUS | Status: AC
  Filled 2016-10-04: qty 50

## 2016-10-04 NOTE — Anesthesia Post-op Follow-up Note (Cosign Needed)
Anesthesia QCDR form completed.        

## 2016-10-04 NOTE — Anesthesia Postprocedure Evaluation (Signed)
Anesthesia Post Note  Patient: Stanley Whitney  Procedure(s) Performed: Procedure(s) (LRB): COLONOSCOPY WITH PROPOFOL (N/A)  Patient location during evaluation: PACU Anesthesia Type: General Level of consciousness: awake and alert and oriented Pain management: pain level controlled Vital Signs Assessment: post-procedure vital signs reviewed and stable Respiratory status: spontaneous breathing Cardiovascular status: blood pressure returned to baseline Anesthetic complications: no     Last Vitals:  Vitals:   10/04/16 0820 10/04/16 0830  BP: (!) 133/94 (!) 138/100  Pulse: 68 60  Resp: 15 15  Temp:      Last Pain:  Vitals:   10/04/16 0800  TempSrc: Tympanic                 Parmvir Boomer

## 2016-10-04 NOTE — Anesthesia Procedure Notes (Signed)
Date/Time: 10/04/2016 7:36 AM Performed by: Nelda Marseille Pre-anesthesia Checklist: Patient identified, Emergency Drugs available, Suction available, Patient being monitored and Timeout performed Oxygen Delivery Method: Nasal cannula

## 2016-10-04 NOTE — Op Note (Signed)
[facility] Gastroenterology Patient Name: Stanley Whitney Procedure Date: 10/04/2016 7:29 AM MRN: [id number] Account #: [id number] Date of Birth: 03-10-1961 Admit Type: Outpatient Age: 56 Room: [facility] ENDO ROOM 1 Gender: Male Note Status: Finalized Procedure:            Colonoscopy Indications:          High risk colon cancer surveillance: Personal history                        of colonic polyps Providers:            [name] MD, MD Referring MD:         Venetia Maxon. Elijio Miles, MD (Referring MD) Medicines:            Monitored Anesthesia Care Complications:        No immediate complications. Procedure:            Pre-Anesthesia Assessment:                       - Prior to the procedure, a History and Physical was                        performed, and patient medications, allergies and                        sensitivities were reviewed. The patient's tolerance of                        previous anesthesia was reviewed.                       - The risks and benefits of the procedure and the                        sedation options and risks were discussed with the                        patient. All questions were answered and informed                        consent was obtained.                       - ASA Grade Assessment: III - A patient with severe                        systemic disease.                       After obtaining informed consent, the colonoscope was                        passed under direct vision. Throughout the procedure,                        the patient's blood pressure, pulse, and oxygen                        saturations were monitored continuously. The  Colonoscope was introduced through the anus and                        advanced to the the cecum, identified by the                        appendiceal orifice, IC valve and transillumination.                        The colonoscopy was performed with ease. The patient                    tolerated the procedure well. The quality of the bowel                        preparation was excellent. Findings:      The perianal and digital rectal examinations were normal.      Non-bleeding internal hemorrhoids were found during retroflexion. The       hemorrhoids were medium-sized and Grade I (internal hemorrhoids that do       not prolapse).      Three sessile polyps were found in the rectum and ascending colon. The       polyps were 6 to 8 mm in size. These polyps were removed with a cold       snare. Resection and retrieval were complete.      A 5 mm polyp was found in the cecum. The polyp was sessile. The polyp       was removed with a cold biopsy forceps. Resection and retrieval were       complete.      The exam was otherwise without abnormality. Impression:           - Non-bleeding internal hemorrhoids.                       - Three 6 to 8 mm polyps in the rectum and in the                        ascending colon, removed with a cold snare. Resected                        and retrieved.                       - One 5 mm polyp in the cecum, removed with a cold                        biopsy forceps. Resected and retrieved.                       - The examination was otherwise normal. Recommendation:       - Discharge patient to home (with escort).                       - Resume previous diet.                       - Continue present medications.                       - Await pathology results.                       -  Repeat colonoscopy in 3 years for surveillance.                       - Consider genetic testing for attenuated FAP. If                        interested please come to my office to discuss a blood                        test for genetics Procedure Code(s):    --- Professional ---                       [phone], Colonoscopy, flexible; with removal of tumor(s),                        polyp(s), or other lesion(s) by snare technique                        45380, 59, Colonoscopy, flexible; with biopsy, single                        or multiple Diagnosis Code(s):    --- Professional ---                       Z86.010, Personal history of colonic polyps                       K62.1, Rectal polyp                       D12.2, Benign neoplasm of ascending colon                       D12.0, Benign neoplasm of cecum                       K64.0, First degree hemorrhoids CPT copyright 2016 American Medical Association. All rights reserved. The codes documented in this report are preliminary and upon coder review may  be revised to meet current compliance requirements. [name], MD [name] MD, MD 10/04/2016 7:59:00 AM This report has been signed electronically. Number of Addenda: 0 Note Initiated On: 10/04/2016 7:29 AM Scope Withdrawal Time: 0 hours 21 minutes 8 seconds  Total Procedure Duration: 0 hours 23 minutes 2 seconds       [facility]

## 2016-10-04 NOTE — Anesthesia Preprocedure Evaluation (Signed)
Anesthesia Evaluation  Patient identified by MRN, date of birth, ID band Patient awake    Reviewed: Allergy & Precautions, H&P , NPO status , Patient's Chart, lab work & pertinent test results  History of Anesthesia Complications Negative for: history of anesthetic complications  Airway Mallampati: III  TM Distance: <3 FB Neck ROM: limited    Dental  (+) Poor Dentition   Pulmonary neg shortness of breath, former smoker,           Cardiovascular Exercise Tolerance: Good hypertension, (-) angina(-) Past MI and (-) DOE      Neuro/Psych negative neurological ROS  negative psych ROS   GI/Hepatic negative GI ROS, Neg liver ROS,   Endo/Other  negative endocrine ROS  Renal/GU Renal disease  negative genitourinary   Musculoskeletal   Abdominal   Peds  Hematology negative hematology ROS (+)   Anesthesia Other Findings  Signs and symptoms suggestive of sleep apnea   Past Medical History: No date: Bilateral incarcerated inguinal hernia No date: Heartburn No date: HLD (hyperlipidemia) No date: Hypertension No date: Kidney stone  Past Surgical History: 06/16/2016: COLONOSCOPY WITH PROPOFOL N/A     Comment: Procedure: COLONOSCOPY WITH PROPOFOL;                Surgeon: [name], MD;  Location: [facility]               ENDOSCOPY;  Service: Endoscopy;  Laterality:               N/A; No date: CYST REMOVAL TRUNK     Comment: Back No date: none  BMI    Body Mass Index:  30.11 kg/m      Reproductive/Obstetrics negative OB ROS                             Anesthesia Physical Anesthesia Plan  ASA: III  Anesthesia Plan: General   Post-op Pain Management:    Induction: Intravenous  PONV Risk Score and Plan:   Airway Management Planned: Natural Airway and Nasal Cannula  Additional Equipment:   Intra-op Plan:   Post-operative Plan:   Informed Consent: I have reviewed the patients History  and Physical, chart, labs and discussed the procedure including the risks, benefits and alternatives for the proposed anesthesia with the patient or authorized representative who has indicated his/her understanding and acceptance.   Dental Advisory Given  Plan Discussed with: Anesthesiologist, CRNA and Surgeon  Anesthesia Plan Comments: (Patient consented for risks of anesthesia including but not limited to:  - adverse reactions to medications - damage to teeth, lips or other oral mucosa - sore throat or hoarseness - Damage to heart, brain, lungs or loss of life  Patient voiced understanding.)        Anesthesia Quick Evaluation

## 2016-10-04 NOTE — H&P (Signed)
[name] MD [address] [place], [place] [address] Phone: [phone] Fax : [phone]  Primary Care Physician:  Jodi Marble, MD Primary Gastroenterologist:  Dr. [name]   Pre-Procedure History & Physical: HPI:  Stanley Whitney is a 56 y.o. male is here for an colonoscopy.   Past Medical History:  Diagnosis Date  . Bilateral incarcerated inguinal hernia   . Heartburn   . HLD (hyperlipidemia)   . Hypertension   . Kidney stone     Past Surgical History:  Procedure Laterality Date  . COLONOSCOPY WITH PROPOFOL N/A 06/16/2016   Procedure: COLONOSCOPY WITH PROPOFOL;  Surgeon: [name], MD;  Location: [facility] ENDOSCOPY;  Service: Endoscopy;  Laterality: N/A;  . CYST REMOVAL TRUNK     Back  . none      Prior to Admission medications   Medication Sig Start Date End Date Taking? Authorizing Provider  atenolol (TENORMIN) 50 MG tablet Take 50 mg by mouth daily.   Yes [provider]  esomeprazole (NEXIUM) 40 MG capsule Take 40 mg by mouth every evening.  11/07/15  Yes [provider]  hydrochlorothiazide (HYDRODIURIL) 12.5 MG tablet Take 12.5 mg by mouth daily.  10/24/15  Yes [provider]  Omega-3 Fatty Acids (FISH OIL) 1000 MG CAPS Take 2 capsules by mouth daily.   Yes [provider]  rosuvastatin (CRESTOR) 5 MG tablet Take 5 mg by mouth daily.   Yes [provider]  doxycycline (VIBRA-TABS) 100 MG tablet Take 1 tablet (100 mg total) by mouth 2 (two) times daily. Started two or three days before procedure Patient not taking: Reported on 10/04/2016 07/18/16   Christene Lye, MD    Allergies as of 06/17/2016  . (No Known Allergies)    Family History  Problem Relation Age of Onset  . Kidney disease Neg Hx   . Prostate cancer Neg Hx     Social History   Social History  . Marital status: Married    Spouse name: N/A  . Number of children: N/A  . Years of education: N/A   Occupational History  . Not on  file.   Social History Main Topics  . Smoking status: Former Research scientist (life sciences)  . Smokeless tobacco: Former Systems developer     Comment: quit over 30 years  . Alcohol use Yes     Comment: social  . Drug use: No  . Sexual activity: Not on file   Other Topics Concern  . Not on file   Social History Narrative  . No narrative on file    Review of Systems: See HPI, otherwise negative ROS  Physical Exam: BP (!) 147/90   Pulse 76   Temp 97.2 F (36.2 C) (Tympanic)   Resp 18   Ht 5\' 8"  (1.727 m)   Wt 198 lb (89.8 kg)   SpO2 98%   BMI 30.11 kg/m  General:   Alert,  pleasant and cooperative in NAD Head:  Normocephalic and atraumatic. Neck:  Supple; no masses or thyromegaly. Lungs:  Clear throughout to auscultation.    Heart:  Regular rate and rhythm. Abdomen:  Soft, nontender and nondistended. Normal bowel sounds, without guarding, and without rebound.   Neurologic:  Alert and  oriented x4;  grossly normal neurologically.  Impression/Plan: Stanley Whitney is here for an colonoscopy to be performed for surveillance due to personal history of colon polyps  Risks, benefits, limitations, and alternatives regarding  colonoscopy have been reviewed with the patient.  Questions  have been answered.  All parties agreeable.   [name], MD  10/04/2016, 7:28 AM

## 2016-10-04 NOTE — Transfer of Care (Signed)
Immediate Anesthesia Transfer of Care Note  Patient: Stanley Whitney  Procedure(s) Performed: Procedure(s): COLONOSCOPY WITH PROPOFOL (N/A)  Patient Location: PACU  Anesthesia Type:General  Level of Consciousness: sedated  Airway & Oxygen Therapy: Patient Spontanous Breathing and Patient connected to nasal cannula oxygen  Post-op Assessment: Report given to RN and Post -op Vital signs reviewed and stable  Post vital signs: Reviewed and stable  Last Vitals:  Vitals:   10/04/16 0659 10/04/16 0800  BP: (!) 147/90 116/61  Pulse: 76 73  Resp: 18 10  Temp: 36.2 C (!) 36 C    Last Pain:  Vitals:   10/04/16 0800  TempSrc: Tympanic         Complications: No apparent anesthesia complications

## 2016-10-05 NOTE — Progress Notes (Signed)
No voicemail Left.

## 2016-10-06 ENCOUNTER — Encounter: Payer: Self-pay | Admitting: Gastroenterology

## 2016-10-06 LAB — SURGICAL PATHOLOGY

## 2016-10-10 ENCOUNTER — Encounter: Payer: Self-pay | Admitting: Gastroenterology

## 2017-03-10 ENCOUNTER — Ambulatory Visit: Payer: Self-pay | Admitting: Surgery

## 2017-03-10 NOTE — H&P (Signed)
Stanley Whitney 03/10/2017 1:46 PM Location: [place] Office Patient #: [id number] DOB: 10/30/1960 Married / Language: Stanley Whitney / Race: White Male  History of Present Illness (Stanley Santelli A. Kae Heller MD; 03/10/2017 2:03 PM) Patient words: very pleasant 56 year old gentleman presents with a right inguinal hernia which he noticed several weeks ago. every year in the winter time he develops a chronic nonproductive cough which he thinks is associated with getting a flu shot, and this year during this bout of coughing is when he first noticed the bulge in his right groin. He states that it swells and goes down depending on time of day and activity. It is causing some discomfort but is not particularly painful. He denies urinary symptoms, denies nausea vomiting bloating or constipation. He has never had any abdominal surgery and no prior hernia surgery. Does not smoke. 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 He states he also has hernias on both sides of his abdomen around the level of the umbilicus which had been there for 3 years.  The patient is a 56 year old male.   Diagnostic Studies History Stanley Whitney; 08/04/9765 3:41 PM) Colonoscopy within last year  Allergies Stanley Whitney; 93/10/9022 0:97 PM) No Known Allergies 03/10/2017 Allergies Reconciled  Medication History Stanley Whitney; 35/06/2990 4:26 PM) Virtussin A/C (100-10MG /5ML Solution, Oral) Active. Atenolol (25MG  Tablet, Oral) Active. Rosuvastatin Calcium (5MG  Tablet, Oral) Active. HydroCHLOROthiazide (12.5MG  Tablet, Oral) Active. MethylPREDNISolone (4MG  Tab Ther Pack, Oral) Active. Fish Oil Burp-Less (1000MG  Capsule, Oral) Active. Medications Reconciled  Social History Stanley Whitney; 83/07/1960 2:29 PM) Alcohol use Occasional alcohol use. Caffeine use Carbonated beverages, Coffee, Tea. No drug use Tobacco use Former smoker.  Family History Stanley Lorenzo,  Whitney; 79/11/9209 9:41 PM) Breast Cancer Mother.  Other Problems Stanley Whitney; 74/0/8144 8:18 PM) Back Pain Gastroesophageal Reflux Disease High blood pressure Hypercholesterolemia Inguinal Hernia     Review of Systems Stanley Billings Dockery Whitney; 56/06/1495 0:26 PM) General Present- Appetite Loss. Not Present- Chills, Fatigue, Fever, Night Sweats, Weight Gain and Weight Loss. Skin Not Present- Change in Wart/Mole, Dryness, Hives, Jaundice, New Lesions, Non-Healing Wounds, Rash and Ulcer. HEENT Present- Ringing in the Ears, Seasonal Allergies, Sinus Pain, Sore Throat and Wears glasses/contact lenses. Not Present- Earache, Hearing Loss, Hoarseness, Nose Bleed, Oral Ulcers, Visual Disturbances and Yellow Eyes. Respiratory Present- Chronic Cough, Difficulty Breathing, Snoring and Wheezing. Not Present- Bloody sputum. Breast Not Present- Breast Mass, Breast Pain, Nipple Discharge and Skin Changes. Cardiovascular Not Present- Chest Pain, Difficulty Breathing Lying Down, Leg Cramps, Palpitations, Rapid Heart Rate, Shortness of Breath and Swelling of Extremities. Gastrointestinal Not Present- Abdominal Pain, Bloating, Bloody Stool, Change in Bowel Habits, Chronic diarrhea, Constipation, Difficulty Swallowing, Excessive gas, Gets full quickly at meals, Hemorrhoids, Indigestion, Nausea, Rectal Pain and Vomiting. Male Genitourinary Not Present- Blood in Urine, Change in Urinary Stream, Frequency, Impotence, Nocturia, Painful Urination, Urgency and Urine Leakage. Musculoskeletal Present- Back Pain, Joint Pain, Joint Stiffness, Muscle Pain, Muscle Weakness and Swelling of Extremities. Neurological Not Present- Decreased Memory, Fainting, Headaches, Numbness, Seizures, Tingling, Tremor, Trouble walking and Weakness. Psychiatric Present- Depression. Not Present- Anxiety, Bipolar, Change in Sleep Pattern, Fearful and Frequent crying. Endocrine Not Present- Cold Intolerance, Excessive Hunger, Hair Changes,  Heat Intolerance, Hot flashes and New Diabetes. Hematology Not Present- Blood Thinners, Easy Bruising, Excessive bleeding, Gland problems, HIV and Persistent Infections.  Vitals Stanley Billings Dockery Whitney; 37/11/5883 0:27 PM) 03/10/2017 1:47 PM Weight: 205.4 lb Height: 68in Body Surface Area: 2.07 m  Body Mass Index: 31.23 kg/m  Temp.: 98.70F(Oral)  Pulse: 75 (Regular)  BP: 130/80 (Sitting, Right Arm, Standard)      Physical Exam (Stanley Bither A. Kae Heller MD; 03/10/2017 2:05 PM)  General Note: alert and well appearing  Integumentary Note: warm and dry  Head and Neck Note: no mass or thyromegaly  Eye Note: No scleral icterus. Extra ocular motions intact.  ENMT Note: Moist mucous membranes, dentition intact  Chest and Lung Exam Note: Unlabored respirations, clear bilaterally  Cardiovascular Note: Regular rate and rhythm, no pedal edema  Abdomen Note: Soft, obese, nontender nondistended. No mass or organomegaly. there is a large right inguinal hernia present. No left internal hernia, no umbilical hernia. I do not appreciate a hernia where he is describing a bulge on either side of his abdomen  Neurologic Note: Grossly intact, normal gait  Neuropsychiatric Note: Normal mood and affect. Appropriate insight.  Musculoskeletal Note: Strength symmetrical throughout, no deformity    Assessment & Plan (Stanley Freund A. Kae Heller MD; 03/10/2017 2:06 PM)  RIGHT INGUINAL HERNIA (K40.90) Story: we discussed the natural history of hernias and the anatomy of the inguinal region. I recommended open repair with mesh we discussed the procedure in detail including risks of bleeding, infection, pain, scarring, injury to structures such as the gonadal vessels, vas deferens, or nerves in the area and resultant risk of chronic pain or numbness. Discussed risk of hernia recurrence. Questions were welcomed and answered. We will schedule him for surgery in the next few weeks.

## 2017-04-17 NOTE — Pre-Procedure Instructions (Signed)
[name]  04/17/2017      [facility] [place], [place] - [address] [place] [address] Phone: [phone] Fax: [phone]    Your procedure is scheduled on January 18  Report to [place] at [place].M.  Call this number if you have problems the morning of surgery:  [phone]   Remember:  Do not eat food or drink liquids after midnight.  Continue all medications as directed by your physician except follow these medication instructions before surgery below   Take these medicines the morning of surgery with A SIP OF WATER  atenolol (TENORMIN)   7 days prior to surgery STOP taking any Aspirin(unless otherwise instructed by your surgeon), Aleve, Naproxen, Ibuprofen, Motrin, Advil, Goody's, BC's, all herbal medications, fish oil, and all vitamins    Do not wear jewelry  Do not wear lotions, powders, or cologne, or deodorant.  Men may shave face and neck.  Do not bring valuables to the hospital.  [facility] is not responsible for any belongings or valuables.  Contacts, dentures or bridgework may not be worn into surgery.  Leave your suitcase in the car.  After surgery it may be brought to your room.  For patients admitted to the hospital, discharge time will be determined by your treatment team.  Patients discharged the day of surgery will not be allowed to drive home.    Special instructions:   [place]- Preparing For Surgery  Before surgery, you can play an important role. Because skin is not sterile, your skin needs to be as free of germs as possible. You can reduce the number of germs on your skin by washing with CHG (chlorahexidine gluconate) Soap before surgery.  CHG is an antiseptic cleaner which kills germs and bonds with the skin to continue killing germs even after washing.  Please do not use if you have an allergy to CHG or antibacterial soaps. If your skin becomes reddened/irritated stop using  the CHG.  Do not shave (including legs and underarms) for at least 48 hours prior to first CHG shower. It is OK to shave your face.  Please follow these instructions carefully.   1. Shower the NIGHT BEFORE SURGERY and the MORNING OF SURGERY with CHG.   2. If you chose to wash your hair, wash your hair first as usual with your normal shampoo.  3. After you shampoo, rinse your hair and body thoroughly to remove the shampoo.  4. Use CHG as you would any other liquid soap. You can apply CHG directly to the skin and wash gently with a scrungie or a clean washcloth.   5. Apply the CHG Soap to your body ONLY FROM THE NECK DOWN.  Do not use on open wounds or open sores. Avoid contact with your eyes, ears, mouth and genitals (private parts). Wash Face and genitals (private parts)  with your normal soap.  6. Wash thoroughly, paying special attention to the area where your surgery will be performed.  7. Thoroughly rinse your body with warm water from the neck down.  8. DO NOT shower/wash with your normal soap after using and rinsing off the CHG Soap.  9. Pat yourself dry with a CLEAN TOWEL.  10. Wear CLEAN PAJAMAS to bed the night before surgery, wear comfortable clothes the morning of surgery  11. Place CLEAN SHEETS on your bed the night of your first shower and DO NOT SLEEP WITH PETS.  Day of Surgery: Do not apply any deodorants/lotions. Please wear clean clothes to the hospital/surgery center.      Please read over the following fact sheets that you were given.

## 2017-04-18 ENCOUNTER — Encounter (HOSPITAL_COMMUNITY)
Admission: RE | Admit: 2017-04-18 | Discharge: 2017-04-18 | Disposition: A | Discharge status: Home / Self Care | Payer: BLUE CROSS/BLUE SHIELD | Source: Ambulatory Visit | Attending: Surgery | Admitting: Surgery

## 2017-04-18 ENCOUNTER — Encounter (HOSPITAL_COMMUNITY): Payer: Self-pay

## 2017-04-18 ENCOUNTER — Other Ambulatory Visit: Payer: Self-pay

## 2017-04-18 DIAGNOSIS — K219 Gastro-esophageal reflux disease without esophagitis: Secondary | ICD-10-CM | POA: Diagnosis not present

## 2017-04-18 DIAGNOSIS — N9972 Accidental puncture and laceration of a genitourinary system organ or structure during other procedure: Secondary | ICD-10-CM | POA: Diagnosis not present

## 2017-04-18 DIAGNOSIS — Z803 Family history of malignant neoplasm of breast: Secondary | ICD-10-CM | POA: Diagnosis not present

## 2017-04-18 DIAGNOSIS — K4090 Unilateral inguinal hernia, without obstruction or gangrene, not specified as recurrent: Secondary | ICD-10-CM | POA: Diagnosis present

## 2017-04-18 DIAGNOSIS — Z01812 Encounter for preprocedural laboratory examination: Secondary | ICD-10-CM | POA: Diagnosis not present

## 2017-04-18 DIAGNOSIS — N323 Diverticulum of bladder: Secondary | ICD-10-CM | POA: Diagnosis not present

## 2017-04-18 DIAGNOSIS — I10 Essential (primary) hypertension: Secondary | ICD-10-CM | POA: Diagnosis not present

## 2017-04-18 DIAGNOSIS — E7800 Pure hypercholesterolemia, unspecified: Secondary | ICD-10-CM | POA: Diagnosis not present

## 2017-04-18 DIAGNOSIS — Z79899 Other long term (current) drug therapy: Secondary | ICD-10-CM | POA: Diagnosis not present

## 2017-04-18 DIAGNOSIS — Z87891 Personal history of nicotine dependence: Secondary | ICD-10-CM | POA: Diagnosis not present

## 2017-04-18 HISTORY — DX: Personal history of urinary calculi: Z87.442

## 2017-04-18 LAB — CBC WITH DIFFERENTIAL/PLATELET
Basophils Absolute: 0.1 10*3/uL (ref 0.0–0.1)
Basophils Relative: 1 %
EOS PCT: 2 %
Eosinophils Absolute: 0.2 10*3/uL (ref 0.0–0.7)
HEMATOCRIT: 48.3 % (ref 39.0–52.0)
Hemoglobin: 16.1 g/dL (ref 13.0–17.0)
LYMPHS PCT: 24 %
Lymphs Abs: 2.4 10*3/uL (ref 0.7–4.0)
MCH: 30.9 pg (ref 26.0–34.0)
MCHC: 33.3 g/dL (ref 30.0–36.0)
MCV: 92.7 fL (ref 78.0–100.0)
MONO ABS: 0.8 10*3/uL (ref 0.1–1.0)
MONOS PCT: 8 %
NEUTROS ABS: 6.6 10*3/uL (ref 1.7–7.7)
Neutrophils Relative %: 65 %
Platelets: 284 10*3/uL (ref 150–400)
RBC: 5.21 MIL/uL (ref 4.22–5.81)
RDW: 14 % (ref 11.5–15.5)
WBC: 10.1 10*3/uL (ref 4.0–10.5)

## 2017-04-18 LAB — BASIC METABOLIC PANEL
ANION GAP: 9 (ref 5–15)
BUN: 15 mg/dL (ref 6–20)
CALCIUM: 9.7 mg/dL (ref 8.9–10.3)
CO2: 29 mmol/L (ref 22–32)
Chloride: 99 mmol/L — ABNORMAL LOW (ref 101–111)
Creatinine, Ser: 1.35 mg/dL — ABNORMAL HIGH (ref 0.61–1.24)
GFR calc Af Amer: >60 mL/min (ref >60)
GFR calc non Af Amer: 58 mL/min — ABNORMAL LOW (ref >60)
GLUCOSE: 106 mg/dL — AB (ref 65–99)
Potassium: 3.7 mmol/L (ref 3.5–5.1)
Sodium: 137 mmol/L (ref 135–145)

## 2017-04-18 NOTE — Progress Notes (Signed)
   04/18/17 0818  OBSTRUCTIVE SLEEP APNEA  Have you ever been diagnosed with sleep apnea through a sleep study? No  Do you snore loudly (loud enough to be heard through closed doors)?  1  Do you often feel tired, fatigued, or sleepy during the daytime (such as falling asleep during driving or talking to someone)? 0  Has anyone observed you stop breathing during your sleep? 1  Do you have, or are you being treated for high blood pressure? 1  BMI more than 35 kg/m2? 0  Age > 50 (1-yes) 1  Neck circumference greater than:Male 16 inches or larger, Male 17inches or larger? 1  Male Gender (Yes=1) 1  Obstructive Sleep Apnea Score 6  Score 5 or greater  Results sent to PCP

## 2017-04-19 NOTE — Progress Notes (Signed)
Anesthesia Chart Review:  Pt is a 57 year old male scheduled for open R inguinal hernia repair with mesh on 04/21/2017 with Romana Juniper, MD  - PCP is Pernell Dupre, MD  PMH includes:  HTN, hyperlipidemia. Former smoker. BMI 30  Medications include: Atenolol, HCTZ, rosuvastatin  BP (!) 149/92   Pulse 78   Temp 36.7 C   Resp 20   Ht 5\' 9"  (1.753 m)   Wt 205 lb 9.6 oz (93.3 kg)   SpO2 97%   BMI 30.36 kg/m   Preoperative labs reviewed.    EKG 06/13/16 (PCPs office): NSR.  Anterior infarct, age undetermined.  Appears stable when compared to EKG 11/10/15.  If no changes, I anticipate pt can proceed with surgery as scheduled.   Willeen Cass, FNP-BC [facility]/Anesthesiology Phone: [phone] 04/19/2017 11:55 AM

## 2017-04-20 ENCOUNTER — Encounter (HOSPITAL_COMMUNITY): Payer: Self-pay | Admitting: Certified Registered Nurse Anesthetist

## 2017-04-20 NOTE — Anesthesia Preprocedure Evaluation (Addendum)
Anesthesia Evaluation  Patient identified by MRN, date of birth, ID band Patient awake    Reviewed: Allergy & Precautions, NPO status , Patient's Chart, lab work & pertinent test results, reviewed documented beta blocker date and time   History of Anesthesia Complications Negative for: history of anesthetic complications  Airway Mallampati: II  TM Distance: >3 FB Neck ROM: Full    Dental  (+) Dental Advisory Given   Pulmonary former smoker,    breath sounds clear to auscultation       Cardiovascular hypertension, Pt. on medications and Pt. on home beta blockers (-) angina Rhythm:Regular Rate:Normal     Neuro/Psych negative neurological ROS     GI/Hepatic negative GI ROS, Neg liver ROS,   Endo/Other  negative endocrine ROS  Renal/GU H/o stones     Musculoskeletal   Abdominal (+) - obese,   Peds  Hematology negative hematology ROS (+)   Anesthesia Other Findings   Reproductive/Obstetrics                            Anesthesia Physical Anesthesia Plan  ASA: II  Anesthesia Plan: General   Post-op Pain Management: GA combined w/ Regional for post-op pain   Induction: Intravenous  PONV Risk Score and Plan: 2 and Ondansetron and Dexamethasone  Airway Management Planned: Oral ETT  Additional Equipment:   Intra-op Plan:   Post-operative Plan: Extubation in OR  Informed Consent: I have reviewed the patients History and Physical, chart, labs and discussed the procedure including the risks, benefits and alternatives for the proposed anesthesia with the patient or authorized representative who has indicated his/her understanding and acceptance.   Dental advisory given  Plan Discussed with: CRNA and Surgeon  Anesthesia Plan Comments: (Plan routine monitors, GETA with TAP block for post op analgesia)        Anesthesia Quick Evaluation

## 2017-04-21 ENCOUNTER — Ambulatory Visit (HOSPITAL_COMMUNITY): Payer: BLUE CROSS/BLUE SHIELD | Admitting: Certified Registered Nurse Anesthetist

## 2017-04-21 ENCOUNTER — Observation Stay (HOSPITAL_COMMUNITY)
Admission: RE | Admit: 2017-04-21 | Discharge: 2017-04-22 | Disposition: A | Discharge status: Home / Self Care | Payer: BLUE CROSS/BLUE SHIELD | Source: Ambulatory Visit | Attending: Surgery | Admitting: Surgery

## 2017-04-21 ENCOUNTER — Encounter (HOSPITAL_COMMUNITY): Payer: Self-pay

## 2017-04-21 ENCOUNTER — Encounter (HOSPITAL_COMMUNITY)
Admission: RE | Disposition: A | Discharge status: Home / Self Care | Payer: Self-pay | Source: Ambulatory Visit | Attending: Surgery

## 2017-04-21 ENCOUNTER — Ambulatory Visit (HOSPITAL_COMMUNITY): Payer: BLUE CROSS/BLUE SHIELD | Admitting: Emergency Medicine

## 2017-04-21 DIAGNOSIS — Z9889 Other specified postprocedural states: Secondary | ICD-10-CM

## 2017-04-21 DIAGNOSIS — Z01812 Encounter for preprocedural laboratory examination: Secondary | ICD-10-CM | POA: Insufficient documentation

## 2017-04-21 DIAGNOSIS — E7800 Pure hypercholesterolemia, unspecified: Secondary | ICD-10-CM | POA: Insufficient documentation

## 2017-04-21 DIAGNOSIS — N9972 Accidental puncture and laceration of a genitourinary system organ or structure during other procedure: Secondary | ICD-10-CM | POA: Insufficient documentation

## 2017-04-21 DIAGNOSIS — K4090 Unilateral inguinal hernia, without obstruction or gangrene, not specified as recurrent: Secondary | ICD-10-CM | POA: Diagnosis not present

## 2017-04-21 DIAGNOSIS — N323 Diverticulum of bladder: Secondary | ICD-10-CM | POA: Insufficient documentation

## 2017-04-21 DIAGNOSIS — Z79899 Other long term (current) drug therapy: Secondary | ICD-10-CM | POA: Insufficient documentation

## 2017-04-21 DIAGNOSIS — K219 Gastro-esophageal reflux disease without esophagitis: Secondary | ICD-10-CM | POA: Insufficient documentation

## 2017-04-21 DIAGNOSIS — Z87891 Personal history of nicotine dependence: Secondary | ICD-10-CM | POA: Insufficient documentation

## 2017-04-21 DIAGNOSIS — I10 Essential (primary) hypertension: Secondary | ICD-10-CM | POA: Insufficient documentation

## 2017-04-21 DIAGNOSIS — Z803 Family history of malignant neoplasm of breast: Secondary | ICD-10-CM | POA: Insufficient documentation

## 2017-04-21 HISTORY — PX: INSERTION OF MESH: SHX5868

## 2017-04-21 HISTORY — PX: BLADDER REPAIR: SHX6721

## 2017-04-21 HISTORY — PX: INGUINAL HERNIA REPAIR: SHX194

## 2017-04-21 SURGERY — REPAIR, HERNIA, INGUINAL, ADULT
Anesthesia: General | Site: Inguinal | Laterality: Right

## 2017-04-21 MED ORDER — ATENOLOL 25 MG PO TABS
25.0000 mg | ORAL_TABLET | Freq: Every day | ORAL | Status: DC
  Administered 2017-04-22: 25 mg via ORAL
  Filled 2017-04-21: qty 1

## 2017-04-21 MED ORDER — DEXAMETHASONE SODIUM PHOSPHATE 10 MG/ML IJ SOLN
INTRAMUSCULAR | Status: AC
  Filled 2017-04-21: qty 1

## 2017-04-21 MED ORDER — SUGAMMADEX SODIUM 200 MG/2ML IV SOLN
INTRAVENOUS | Status: AC
  Filled 2017-04-21: qty 2

## 2017-04-21 MED ORDER — ARTIFICIAL TEARS OPHTHALMIC OINT
TOPICAL_OINTMENT | OPHTHALMIC | Status: AC
  Filled 2017-04-21: qty 3.5

## 2017-04-21 MED ORDER — METOPROLOL TARTRATE 5 MG/5ML IV SOLN: 5.0000 mg | Freq: Four times a day (QID) | INTRAVENOUS | Status: DC | PRN

## 2017-04-21 MED ORDER — GABAPENTIN 300 MG PO CAPS
ORAL_CAPSULE | ORAL | Status: AC
  Administered 2017-04-21: 300 mg via ORAL
  Filled 2017-04-21: qty 1

## 2017-04-21 MED ORDER — HYDRALAZINE HCL 20 MG/ML IJ SOLN: 10.0000 mg | INTRAMUSCULAR | Status: DC | PRN

## 2017-04-21 MED ORDER — CEFAZOLIN SODIUM-DEXTROSE 2-4 GM/100ML-% IV SOLN
INTRAVENOUS | Status: AC
  Filled 2017-04-21: qty 100

## 2017-04-21 MED ORDER — LACTATED RINGERS IV SOLN
INTRAVENOUS | Status: DC | PRN
  Administered 2017-04-21 (×3): via INTRAVENOUS

## 2017-04-21 MED ORDER — DOCUSATE SODIUM 100 MG PO CAPS: 100.0000 mg | ORAL_CAPSULE | Freq: Two times a day (BID) | ORAL | 0 refills | Status: AC

## 2017-04-21 MED ORDER — CELECOXIB 200 MG PO CAPS
200.0000 mg | ORAL_CAPSULE | ORAL | Status: AC
  Administered 2017-04-21: 200 mg via ORAL

## 2017-04-21 MED ORDER — 0.9 % SODIUM CHLORIDE (POUR BTL) OPTIME
TOPICAL | Status: DC | PRN
  Administered 2017-04-21: 1000 mL

## 2017-04-21 MED ORDER — HYDROMORPHONE HCL 1 MG/ML IJ SOLN
0.2500 mg | INTRAMUSCULAR | Status: DC | PRN
  Administered 2017-04-21 (×4): 0.25 mg via INTRAVENOUS

## 2017-04-21 MED ORDER — GABAPENTIN 300 MG PO CAPS
300.0000 mg | ORAL_CAPSULE | ORAL | Status: AC
  Administered 2017-04-21: 300 mg via ORAL

## 2017-04-21 MED ORDER — HYDROMORPHONE HCL 1 MG/ML IJ SOLN
INTRAMUSCULAR | Status: AC
  Administered 2017-04-21: 0.25 mg via INTRAVENOUS
  Filled 2017-04-21: qty 1

## 2017-04-21 MED ORDER — SODIUM CHLORIDE 0.9 % IV SOLN
INTRAVENOUS | Status: DC
  Administered 2017-04-21: 21:00:00 via INTRAVENOUS

## 2017-04-21 MED ORDER — ATENOLOL 25 MG PO TABS
25.0000 mg | ORAL_TABLET | Freq: Once | ORAL | Status: AC
  Administered 2017-04-21: 25 mg via ORAL
  Filled 2017-04-21: qty 1

## 2017-04-21 MED ORDER — ATENOLOL 25 MG PO TABS: 25.0000 mg | ORAL_TABLET | Freq: Every day | ORAL | Status: DC

## 2017-04-21 MED ORDER — MIDAZOLAM HCL 2 MG/2ML IJ SOLN
INTRAMUSCULAR | Status: DC | PRN
  Administered 2017-04-21: 2 mg via INTRAVENOUS

## 2017-04-21 MED ORDER — CEFAZOLIN SODIUM-DEXTROSE 2-4 GM/100ML-% IV SOLN
2.0000 g | INTRAVENOUS | Status: AC
  Administered 2017-04-21 (×2): 2 g via INTRAVENOUS

## 2017-04-21 MED ORDER — CEFAZOLIN SODIUM 1 G IJ SOLR
INTRAMUSCULAR | Status: AC
  Filled 2017-04-21: qty 20

## 2017-04-21 MED ORDER — ENOXAPARIN SODIUM 40 MG/0.4ML ~~LOC~~ SOLN
40.0000 mg | SUBCUTANEOUS | Status: DC
  Administered 2017-04-22: 40 mg via SUBCUTANEOUS
  Filled 2017-04-21: qty 0.4

## 2017-04-21 MED ORDER — FLUORESCEIN SODIUM 10 % IV SOLN
500.0000 mg | INTRAVENOUS | Status: AC
  Administered 2017-04-21: 50 mg via INTRAVENOUS
  Filled 2017-04-21: qty 5

## 2017-04-21 MED ORDER — PROPOFOL 10 MG/ML IV BOLUS
INTRAVENOUS | Status: AC
  Filled 2017-04-21: qty 20

## 2017-04-21 MED ORDER — CHLORHEXIDINE GLUCONATE 4 % EX LIQD: 60.0000 mL | Freq: Once | CUTANEOUS | Status: DC

## 2017-04-21 MED ORDER — GLYCOPYRROLATE 0.2 MG/ML IV SOSY
PREFILLED_SYRINGE | INTRAVENOUS | Status: DC | PRN
  Administered 2017-04-21: .2 mg via INTRAVENOUS

## 2017-04-21 MED ORDER — ONDANSETRON 4 MG PO TBDP: 4.0000 mg | ORAL_TABLET | Freq: Four times a day (QID) | ORAL | Status: DC | PRN

## 2017-04-21 MED ORDER — ROCURONIUM BROMIDE 10 MG/ML (PF) SYRINGE
PREFILLED_SYRINGE | INTRAVENOUS | Status: AC
  Filled 2017-04-21: qty 5

## 2017-04-21 MED ORDER — MIDAZOLAM HCL 2 MG/2ML IJ SOLN: 0.5000 mg | Freq: Once | INTRAMUSCULAR | Status: DC | PRN

## 2017-04-21 MED ORDER — ROCURONIUM BROMIDE 100 MG/10ML IV SOLN
INTRAVENOUS | Status: DC | PRN
  Administered 2017-04-21 (×2): 20 mg via INTRAVENOUS
  Administered 2017-04-21: 50 mg via INTRAVENOUS
  Administered 2017-04-21: 20 mg via INTRAVENOUS
  Administered 2017-04-21: 10 mg via INTRAVENOUS

## 2017-04-21 MED ORDER — SUGAMMADEX SODIUM 200 MG/2ML IV SOLN
INTRAVENOUS | Status: DC | PRN
  Administered 2017-04-21: 186.6 mg via INTRAVENOUS

## 2017-04-21 MED ORDER — POLYETHYLENE GLYCOL 3350 17 G PO PACK: 17.0000 g | PACK | Freq: Every day | ORAL | Status: DC | PRN

## 2017-04-21 MED ORDER — ACETAMINOPHEN 500 MG PO TABS
1000.0000 mg | ORAL_TABLET | Freq: Four times a day (QID) | ORAL | Status: DC
  Administered 2017-04-21 – 2017-04-22 (×4): 1000 mg via ORAL
  Filled 2017-04-21 (×4): qty 2

## 2017-04-21 MED ORDER — ONDANSETRON HCL 4 MG/2ML IJ SOLN
INTRAMUSCULAR | Status: DC | PRN
  Administered 2017-04-21: 4 mg via INTRAVENOUS

## 2017-04-21 MED ORDER — GABAPENTIN 300 MG PO CAPS
300.0000 mg | ORAL_CAPSULE | Freq: Three times a day (TID) | ORAL | Status: DC
  Administered 2017-04-21 – 2017-04-22 (×3): 300 mg via ORAL
  Filled 2017-04-21 (×3): qty 1

## 2017-04-21 MED ORDER — HYDROMORPHONE HCL 1 MG/ML IJ SOLN
0.5000 mg | INTRAMUSCULAR | Status: DC | PRN
  Administered 2017-04-21 (×2): 1 mg via INTRAVENOUS
  Filled 2017-04-21 (×2): qty 1

## 2017-04-21 MED ORDER — PROPOFOL 10 MG/ML IV BOLUS
INTRAVENOUS | Status: DC | PRN
  Administered 2017-04-21: 200 mg via INTRAVENOUS

## 2017-04-21 MED ORDER — BUPIVACAINE-EPINEPHRINE 0.5% -1:200000 IJ SOLN
INTRAMUSCULAR | Status: DC | PRN
  Administered 2017-04-21: 30 mL

## 2017-04-21 MED ORDER — CELECOXIB 200 MG PO CAPS
ORAL_CAPSULE | ORAL | Status: AC
  Administered 2017-04-21: 200 mg via ORAL
  Filled 2017-04-21: qty 1

## 2017-04-21 MED ORDER — MIDAZOLAM HCL 2 MG/2ML IJ SOLN
INTRAMUSCULAR | Status: AC
  Filled 2017-04-21: qty 2

## 2017-04-21 MED ORDER — OXYCODONE-ACETAMINOPHEN 5-325 MG PO TABS: 1.0000 | ORAL_TABLET | Freq: Four times a day (QID) | ORAL | 0 refills | Status: DC | PRN

## 2017-04-21 MED ORDER — LIDOCAINE 2% (20 MG/ML) 5 ML SYRINGE
INTRAMUSCULAR | Status: AC
  Filled 2017-04-21: qty 5

## 2017-04-21 MED ORDER — LIDOCAINE HCL (CARDIAC) 20 MG/ML IV SOLN
INTRAVENOUS | Status: DC | PRN
  Administered 2017-04-21: 100 mg via INTRATRACHEAL

## 2017-04-21 MED ORDER — DEXTROSE 5 % IV SOLN
2.0000 g | INTRAVENOUS | Status: DC
  Administered 2017-04-21: 2 g via INTRAVENOUS
  Filled 2017-04-21 (×2): qty 2

## 2017-04-21 MED ORDER — DEXAMETHASONE SODIUM PHOSPHATE 10 MG/ML IJ SOLN
INTRAMUSCULAR | Status: DC | PRN
  Administered 2017-04-21: 10 mg via INTRAVENOUS

## 2017-04-21 MED ORDER — ONDANSETRON HCL 4 MG/2ML IJ SOLN
4.0000 mg | Freq: Four times a day (QID) | INTRAMUSCULAR | Status: DC | PRN
  Administered 2017-04-21: 4 mg via INTRAVENOUS
  Filled 2017-04-21: qty 2

## 2017-04-21 MED ORDER — BISACODYL 10 MG RE SUPP: 10.0000 mg | Freq: Every day | RECTAL | Status: DC | PRN

## 2017-04-21 MED ORDER — PROMETHAZINE HCL 25 MG/ML IJ SOLN: 6.2500 mg | INTRAMUSCULAR | Status: DC | PRN

## 2017-04-21 MED ORDER — OXYCODONE HCL 5 MG PO TABS
5.0000 mg | ORAL_TABLET | ORAL | Status: DC | PRN
  Administered 2017-04-21 – 2017-04-22 (×2): 10 mg via ORAL
  Filled 2017-04-21 (×2): qty 2

## 2017-04-21 MED ORDER — ACETAMINOPHEN 500 MG PO TABS
ORAL_TABLET | ORAL | Status: AC
  Administered 2017-04-21: 1000 mg via ORAL
  Filled 2017-04-21: qty 2

## 2017-04-21 MED ORDER — MEPERIDINE HCL 25 MG/ML IJ SOLN: 6.2500 mg | INTRAMUSCULAR | Status: DC | PRN

## 2017-04-21 MED ORDER — ACETAMINOPHEN 500 MG PO TABS
1000.0000 mg | ORAL_TABLET | ORAL | Status: AC
  Administered 2017-04-21: 1000 mg via ORAL

## 2017-04-21 MED ORDER — BUPIVACAINE-EPINEPHRINE (PF) 0.5% -1:200000 IJ SOLN
INTRAMUSCULAR | Status: DC | PRN
  Administered 2017-04-21: 30 mL

## 2017-04-21 MED ORDER — DIPHENHYDRAMINE HCL 50 MG/ML IJ SOLN: 25.0000 mg | Freq: Four times a day (QID) | INTRAMUSCULAR | Status: DC | PRN

## 2017-04-21 MED ORDER — DOCUSATE SODIUM 100 MG PO CAPS
100.0000 mg | ORAL_CAPSULE | Freq: Two times a day (BID) | ORAL | Status: DC
  Administered 2017-04-21 – 2017-04-22 (×2): 100 mg via ORAL
  Filled 2017-04-21 (×2): qty 1

## 2017-04-21 MED ORDER — OXYBUTYNIN CHLORIDE 5 MG PO TABS
5.0000 mg | ORAL_TABLET | Freq: Three times a day (TID) | ORAL | Status: DC
  Administered 2017-04-21 – 2017-04-22 (×3): 5 mg via ORAL
  Filled 2017-04-21 (×3): qty 1

## 2017-04-21 MED ORDER — FENTANYL CITRATE (PF) 250 MCG/5ML IJ SOLN
INTRAMUSCULAR | Status: DC | PRN
  Administered 2017-04-21: 100 ug via INTRAVENOUS
  Administered 2017-04-21: 50 ug via INTRAVENOUS

## 2017-04-21 MED ORDER — EPHEDRINE SULFATE-NACL 50-0.9 MG/10ML-% IV SOSY
PREFILLED_SYRINGE | INTRAVENOUS | Status: DC | PRN
  Administered 2017-04-21: 5 mg via INTRAVENOUS
  Administered 2017-04-21: 10 mg via INTRAVENOUS
  Administered 2017-04-21 (×3): 5 mg via INTRAVENOUS

## 2017-04-21 MED ORDER — FENTANYL CITRATE (PF) 250 MCG/5ML IJ SOLN
INTRAMUSCULAR | Status: AC
  Filled 2017-04-21: qty 5

## 2017-04-21 MED ORDER — METHOCARBAMOL 500 MG PO TABS
500.0000 mg | ORAL_TABLET | Freq: Four times a day (QID) | ORAL | Status: DC | PRN
  Administered 2017-04-21: 500 mg via ORAL
  Filled 2017-04-21: qty 1

## 2017-04-21 MED ORDER — ONDANSETRON HCL 4 MG/2ML IJ SOLN
INTRAMUSCULAR | Status: AC
  Filled 2017-04-21: qty 2

## 2017-04-21 MED ORDER — DIPHENHYDRAMINE HCL 25 MG PO CAPS
25.0000 mg | ORAL_CAPSULE | Freq: Four times a day (QID) | ORAL | Status: DC | PRN
  Administered 2017-04-22: 25 mg via ORAL
  Filled 2017-04-21: qty 1

## 2017-04-21 MED ORDER — BUPIVACAINE-EPINEPHRINE (PF) 0.5% -1:200000 IJ SOLN
INTRAMUSCULAR | Status: AC
  Filled 2017-04-21: qty 30

## 2017-04-21 SURGICAL SUPPLY — 69 items
BENZOIN TINCTURE PRP APPL 2/3 (GAUZE/BANDAGES/DRESSINGS) IMPLANT
BLADE CLIPPER SURG (BLADE) IMPLANT
BLADE SURG 10 STRL SS (BLADE) ×4 IMPLANT
BLADE SURG 15 STRL LF DISP TIS (BLADE) ×2 IMPLANT
BLADE SURG 15 STRL SS (BLADE) ×2
CHLORAPREP W/TINT 26ML (MISCELLANEOUS) ×4 IMPLANT
CLOSURE WOUND 1/2 X4 (GAUZE/BANDAGES/DRESSINGS)
COVER SURGICAL LIGHT HANDLE (MISCELLANEOUS) ×4 IMPLANT
DRAIN CHANNEL 19F RND (DRAIN) ×4 IMPLANT
DRAIN PENROSE 1/2X12 LTX STRL (WOUND CARE) ×4 IMPLANT
DRAPE LAPAROTOMY TRNSV 102X78 (DRAPE) ×4 IMPLANT
DRAPE UTILITY XL STRL (DRAPES) ×4 IMPLANT
DRSG OPSITE POSTOP 4X10 (GAUZE/BANDAGES/DRESSINGS) ×4 IMPLANT
DRSG OPSITE POSTOP 4X6 (GAUZE/BANDAGES/DRESSINGS) ×4 IMPLANT
DRSG TEGADERM 4X4.75 (GAUZE/BANDAGES/DRESSINGS) IMPLANT
ELECT BLADE 4.0 EZ CLEAN MEGAD (MISCELLANEOUS) ×4
ELECT CAUTERY BLADE 6.4 (BLADE) ×4 IMPLANT
ELECT REM PT RETURN 9FT ADLT (ELECTROSURGICAL) ×4
ELECTRODE BLDE 4.0 EZ CLN MEGD (MISCELLANEOUS) ×2 IMPLANT
ELECTRODE REM PT RTRN 9FT ADLT (ELECTROSURGICAL) ×2 IMPLANT
EVACUATOR SILICONE 100CC (DRAIN) ×4 IMPLANT
GAUZE SPONGE 4X4 12PLY STRL (GAUZE/BANDAGES/DRESSINGS) ×4 IMPLANT
GAUZE SPONGE 4X4 16PLY XRAY LF (GAUZE/BANDAGES/DRESSINGS) ×12 IMPLANT
GLOVE BIO SURGEON STRL SZ 6 (GLOVE) ×4 IMPLANT
GLOVE BIO SURGEON STRL SZ7.5 (GLOVE) ×8 IMPLANT
GLOVE BIOGEL PI IND STRL 6.5 (GLOVE) ×8 IMPLANT
GLOVE BIOGEL PI IND STRL 7.0 (GLOVE) ×2 IMPLANT
GLOVE BIOGEL PI INDICATOR 6.5 (GLOVE) ×8
GLOVE BIOGEL PI INDICATOR 7.0 (GLOVE) ×2
GLOVE ECLIPSE 6.0 STRL STRAW (GLOVE) ×8 IMPLANT
GLOVE INDICATOR 8.0 STRL GRN (GLOVE) ×4 IMPLANT
GLOVE SURG SS PI 6.5 STRL IVOR (GLOVE) ×4 IMPLANT
GLOVE SURG SS PI 7.0 STRL IVOR (GLOVE) ×4 IMPLANT
GOWN STRL REUS W/ TWL LRG LVL3 (GOWN DISPOSABLE) ×10 IMPLANT
GOWN STRL REUS W/ TWL XL LVL3 (GOWN DISPOSABLE) ×2 IMPLANT
GOWN STRL REUS W/TWL LRG LVL3 (GOWN DISPOSABLE) ×10
GOWN STRL REUS W/TWL XL LVL3 (GOWN DISPOSABLE) ×2
KIT BASIN OR (CUSTOM PROCEDURE TRAY) ×4 IMPLANT
KIT ROOM TURNOVER OR (KITS) ×4 IMPLANT
MESH PHASIX RESORB RECT 10X15 (Mesh General) ×4 IMPLANT
NEEDLE HYPO 25GX1X1/2 BEV (NEEDLE) ×4 IMPLANT
NS IRRIG 1000ML POUR BTL (IV SOLUTION) ×4 IMPLANT
PACK SURGICAL SETUP 50X90 (CUSTOM PROCEDURE TRAY) ×4 IMPLANT
PAD ARMBOARD 7.5X6 YLW CONV (MISCELLANEOUS) ×4 IMPLANT
PENCIL BUTTON HOLSTER BLD 10FT (ELECTRODE) ×4 IMPLANT
SPONGE LAP 18X18 X RAY DECT (DISPOSABLE) ×8 IMPLANT
STAPLER VISISTAT 35W (STAPLE) ×4 IMPLANT
STRIP CLOSURE SKIN 1/2X4 (GAUZE/BANDAGES/DRESSINGS) IMPLANT
SUT ETHIBOND 0 MO6 C/R (SUTURE) ×4 IMPLANT
SUT ETHILON 2 0 FS 18 (SUTURE) ×4 IMPLANT
SUT MNCRL AB 4-0 PS2 18 (SUTURE) ×4 IMPLANT
SUT PDS AB 1 TP1 96 (SUTURE) ×8 IMPLANT
SUT SILK 3 0 (SUTURE)
SUT SILK 3-0 18XBRD TIE 12 (SUTURE) IMPLANT
SUT VIC AB 0 CT2 27 (SUTURE) ×4 IMPLANT
SUT VIC AB 2-0 SH 18 (SUTURE) ×4 IMPLANT
SUT VIC AB 2-0 SH 27 (SUTURE) ×6
SUT VIC AB 2-0 SH 27X BRD (SUTURE) ×6 IMPLANT
SUT VIC AB 3-0 SH 27 (SUTURE) ×2
SUT VIC AB 3-0 SH 27XBRD (SUTURE) ×2 IMPLANT
SUT VICRYL AB 3 0 TIES (SUTURE) ×4 IMPLANT
SYR BULB IRRIGATION 50ML (SYRINGE) ×4 IMPLANT
SYR CONTROL 10ML LL (SYRINGE) ×4 IMPLANT
TOWEL OR 17X24 6PK STRL BLUE (TOWEL DISPOSABLE) IMPLANT
TOWEL OR 17X26 10 PK STRL BLUE (TOWEL DISPOSABLE) ×4 IMPLANT
TRAY FOLEY CATH SILVER 16FR (SET/KITS/TRAYS/PACK) ×4 IMPLANT
TUBE CONNECTING 12'X1/4 (SUCTIONS) ×1
TUBE CONNECTING 12X1/4 (SUCTIONS) ×3 IMPLANT
YANKAUER SUCT BULB TIP NO VENT (SUCTIONS) ×4 IMPLANT

## 2017-04-21 NOTE — Op Note (Signed)
Operative Note  Stanley Whitney  [id number]  [id number]  04/21/2017   Surgeon: Victorino Sparrow ConnorMD  Assistant: Annye English, MD; Bjorn Loser, MD  Procedure performed: open right inguinal hernia repair with phasix mesh, exploratory laparotomy and cystotomy repair (performed by Dr. Matilde Sprang)  Preop diagnosis: right inguinal hernia Post-op diagnosis/intraop findings: large right indirect inguinal hernia with an adherent bladder diverticulum densely adherent on the medial aspect of the hernia sac  Specimens: no Retained items: 19 French round Blake drain EBL: 37SE Complications: none  Description of procedure: After obtaining informed consent and placement of a taps block by Dr. Glennon Mac, the patient was taken to the operating room and placed supine on operating room table wheregeneral endotracheal anesthesia was initiated, preoperative antibiotics were administered, SCDs applied, and a formal timeout was performed. A Foley catheter was inserted. The right groin and lower abdomen were clipped, prepped and draped in the usual sterile fashion. An oblique incision was made 1 fingerbreadth superior to the inguinal ligament and the soft tissues dissected using cautery until the external oblique was identified and cleaned off. The external oblique was divided sharply expanding the external ring. The spermatic cord and hernia contents were bluntly separated from the pubic tubercle and encircled with a Penrose. The ilioinguinal nerve was identified and divided between hemostats with each end ligated with a 3-0 Vicryl tie. The vein began to separate the sac from the underlying cord structures. Once the cord was isolated, the Penrose was placed simply around the cord and we confirmed the gonadal vessels, vas and correct location. I then began to clean off the hernia sac which was very thick and contained a large amount of preperitoneal fat. In a place that appeared thin on the lateral aspect of the  sac I made a laceration and entered the sac, I did confirm that this was maintained with the peritoneal cavity and consistent with an indirect hernia. The sac was unusually thickened on the medial aspect, but I attempted to circumferentially amputate the sac just above the level of the internal ring keeping hemostats on the divided edge. As I continued to divide the medial aspect of the sac, I noted that I had entered what appeared to be the bladder. I was able to insert my finger into this hole and confirm that that's what happened is I was able to feel the Foley balloon and catheter within this lumen. At this point Dr. Matilde Sprang was consulted and very kindly came to assist. In the interim, the sac at the internal ring was separated from the herniated bladder and then closed with a pursestring of 0 Vicryl. Please see Dr. Matilde Sprang' s note which has been dictated separately. In brief a low midline laparotomy was created, the bladder dissected and the herniated bladder reduced into the abdomen. The patient received ICG and we confirm that there have been no ureteral injury. He closed the bladder in 2 layers leaving a Foley catheter in place, a 69 [place] round Blake drain was inserted and brought out in the right hemiabdomen and secured the skin with a 3-0 nylon. This sits atop the bladder repair. The laparotomy was closed with running looped #1 PDS starting at either end and tying in the middle. The skin was irrigated and closed with staples. I then returned to the inguinal incision. The floor of the inguinal canal was closed to narrow the internal ring and hopefully reduce the likelihood of recurrent bladder herniation through this space, this was done with interrupted 2-0 Vicryl's.  We then chose a piece of phasic mesh given the cystotomy and contamination with urine. This was trimmed to see the defect. This was secured to the pubic tubercle, and along the Cooper's ligament/ internal oblique superiorly and the  inferior shelving edge using 0 Ethibond sutures. These were interrupted on the superior aspect and running on the inferior aspect.The tails were wrapped around the spermatic cord and then directed laterally to lie flat underneath the external oblique aponeurosis. The tails were sutured to each other to create a narrowed internal ring using a 0 Ethibond, adequate room for the cord was ensured. Once we get this point the Penrose was removed and the cord and reduced back down into the scrotum. external oblique aponeurosis was reapproximated with a running 3-0 Vicryl. Local was infiltrated in the space just deep to the external oblique, within subcutaneous tissues of both incisions. The Scarpa's fascia was reapproximated with interrupted 2-0 Vicryl's and then the skin was closed with staples. Honeycomb dressings were applied and the drain placed to bulb suction. The patient was then awakened, extubated and taken to PACU in stable condition.   All counts were correct at the completion of the case.

## 2017-04-21 NOTE — Anesthesia Postprocedure Evaluation (Signed)
Anesthesia Post Note  Patient: [name]  Procedure(s) Performed: OPEN RIGHT INGUINAL HERNIA REPAIR WITH MESH (Right Inguinal) INSERTION OF MESH; RIGHT INGUINAL (Right Inguinal) REPAIR BLADDER CYSTOTOMY (N/A Abdomen)     Patient location during evaluation: PACU Anesthesia Type: General and Regional Level of consciousness: awake and alert Pain management: pain level controlled Vital Signs Assessment: post-procedure vital signs reviewed and stable Respiratory status: spontaneous breathing, nonlabored ventilation, respiratory function stable and patient connected to nasal cannula oxygen Cardiovascular status: blood pressure returned to baseline and stable Postop Assessment: no apparent nausea or vomiting Anesthetic complications: no    Last Vitals:  Vitals:   04/21/17 1209 04/21/17 1215  BP: 127/71   Pulse: 76 70  Resp: 18 17  Temp: 36.9 C   SpO2: 96% 93%    Last Pain:  Vitals:   04/21/17 0552  TempSrc: Oral                 Winferd Wease,W. EDMOND

## 2017-04-21 NOTE — Op Note (Signed)
Preoperative diagnosis: Bladder laceration Postoperative diagnosis: Bladder laceration Surgery: Repair of bladder laceration and cystotomy Surgeon: Dr. Nicki Reaper Mcdiarmid  The patient has the above diagnoses.  I was called to the operating room by Dr. Kae Heller who located a bladder laceration in the right inguinal incision during a right inguinal hernia repair.  It was reasonably thick walled approximately 5 or 6 cm in length.  There was a lot of soft tissue and likely lipoma or fat related to it.  The decision was made to make a lower abdominal midline incision also including the umbilicus to the level of the symphysis pubis.  Skin incision was made.  Soft tissue was divided.  Fascia was divided in the midline.  Peritoneum was easily opened the length of the incision.  Bookwalter retractor was utilized.  Bowel was retracted upwards.  At the interface of the lower edge of the rectus I took down the lateral bladder pedicles a moderate length on both sides.  I could feel the bladder with a Foley balloon within.  There was a lot of fat and soft tissue around the bladder that was mobilized.  Attention was then made to try to deliver the herniated bladder or diverticulum through the abdominal hernia and wall.  It was actually surprisingly easy to reduce all of the soft tissue from the right inguinal incision pushing the soft tissue and bladder into the pelvis delivering it.  It became obvious then the patient had a large bladder diverticulum but it was quite thick walled.  It was to the patient's right side of midline approximately 4 cm and it did not encroach upon the bladder neck whatsoever.  I did not want to make a separate midline bladder  incision.  The neck of it was bigger than my thumb but it was not able to easily look within the bladder to make certain that the ureters were normal.  I mobilized fat and peritoneum off the dome of the bladder and the posterior dome in the usual fashion again to nicely  mobilize the bladder.  With my finger through the opening of the diverticulum and into the middle aspect of the dome I opened the bladder wall approximately 3 cm.  This opened the bladder very nicely tension-free.  Intravenous fluorscein dye was given.  I inspected the bladder very carefully.  The trigone was normal.  There is excellent efflux bilaterally and this was triple checked.  In the lateral wall of the diverticulum there was one 1 cm opening that I closed full thickness with a running 2-0 Vicryl almost like a figure-of-eight suture.  One could argue there was a second opening a few millimeters in size and is a figure-of-eight closure as well.  I was happy with these closures and they were performed from within the diverticulum.  With the anatomy appropriately laid out I closed the diverticulum and bladder wall beginning with the midline apex in a full-thickness running fashion followed by second layer.  I did not feel the patient should have a bladder diverticulectomy.  The opening to the diverticulum was very large.  I did want to jeopardize potential injury with further dissection.  A 19 French Jackson-Pratt drain was inserted and laid in the pelvic area near the bladder repair.  There was no fluoroscein dye in the pelvis.  I was very pleased with the robust closure.  The patient will be followed as per protocol

## 2017-04-21 NOTE — Anesthesia Procedure Notes (Signed)
Anesthesia Regional Block: TAP block   Pre-Anesthetic Checklist: ,, timeout performed, Correct Patient, Correct Site, Correct Laterality, Correct Procedure, Correct Position, site marked, Risks and benefits discussed,  Surgical consent,  Pre-op evaluation,  At surgeon's request and post-op pain management  Laterality: Right  Prep: chloraprep       Needles:  Injection technique: Single-shot  Needle Type: Echogenic Needle     Needle Length: 9cm  Needle Gauge: 21     Additional Needles:   Procedures:,,,, ultrasound used (permanent image in chart),,,,  Narrative:  Start time: 04/21/2017 7:16 AM End time: 04/21/2017 7:29 AM Injection made incrementally with aspirations every 5 mL.  Performed by: Personally  Anesthesiologist: Annye Asa, MD  Additional Notes: Pt identified in Holding room.  Monitors applied. Working IV access confirmed. Sterile prep, drape R flank.  #21ga ECHOgenic needle into TAP with US guidance.  30cc 0.5% Bupivacaine with 1:200k epi injected incrementally after negative test dose, good TAP spread.  Patient asymptomatic, VSS, no heme aspirated, tolerated well.  Jenita Seashore, MD

## 2017-04-21 NOTE — Discharge Instructions (Signed)
HERNIA REPAIR: POST OP INSTRUCTIONS ° °###################################################################### ° °EAT °Gradually transition to a high fiber diet with a fiber supplement over the next few weeks after discharge.  Start with a pureed / full liquid diet (see below) ° °WALK °Walk an hour a day.  Control your pain to do that.   ° °CONTROL PAIN °Control pain so that you can walk, sleep, tolerate sneezing/coughing, go up/down stairs. ° °HAVE A BOWEL MOVEMENT DAILY °Keep your bowels regular to avoid problems.  OK to try a laxative to override constipation.  OK to use an antidairrheal to slow down diarrhea.  Call if not better after 2 tries ° °CALL IF YOU HAVE PROBLEMS/CONCERNS °Call if you are still struggling despite following these instructions. °Call if you have concerns not answered by these instructions ° °###################################################################### ° ° ° °1. DIET: Follow a light bland diet the first 24 hours after arrival home, such as soup, liquids, crackers, etc.  Be sure to include lots of fluids daily.  Avoid fast food or heavy meals as your are more likely to get nauseated.  Eat a low fat the next few days after surgery. °2. Take your usually prescribed home medications unless otherwise directed. °3. PAIN CONTROL: °a. Pain is best controlled by a usual combination of three different methods TOGETHER: °i. Ice/Heat °ii. Over the counter pain medication °iii. Prescription pain medication °b. Most patients will experience some swelling and bruising around the hernia(s) such as the bellybutton, groins, or old incisions.  Ice packs or heating pads (30-60 minutes up to 6 times a day) will help. Use ice for the first few days to help decrease swelling and bruising, then switch to heat to help relax tight/sore spots and speed recovery.  Some people prefer to use ice alone, heat alone, alternating between ice & heat.  Experiment to what works for you.  Swelling and bruising can take  several weeks to resolve.   °c. It is helpful to take an over-the-counter pain medication regularly for the first few weeks.  Choose one of the following that works best for you: °i. Naproxen (Aleve, etc)  Two 220mg tabs twice a day °ii. Ibuprofen (Advil, etc) Three 200mg tabs four times a day (every meal & bedtime) °iii. Acetaminophen (Tylenol, etc) 325-650mg four times a day (every meal & bedtime) °d. A  prescription for pain medication should be given to you upon discharge.  Take your pain medication as prescribed.  °i. If you are having problems/concerns with the prescription medicine (does not control pain, nausea, vomiting, rash, itching, etc), please call us [phone] to see if we need to switch you to a different pain medicine that will work better for you and/or control your side effect better. °ii. If you need a refill on your pain medication, please contact your pharmacy.  They will contact our office to request authorization. Prescriptions will not be filled after 5 pm or on week-ends. °4. Avoid getting constipated.  Between the surgery and the pain medications, it is common to experience some constipation.  Increasing fluid intake and taking a fiber supplement (such as Metamucil, Citrucel, FiberCon, MiraLax, etc) 1-2 times a day regularly will usually help prevent this problem from occurring.  A mild laxative (prune juice, Milk of Magnesia, MiraLax, etc) should be taken according to package directions if there are no bowel movements after 48 hours.   °5. Wash / shower every day.  You may shower over the dressings as they are waterproof.   °6. Remove   your waterproof bandages 5 days after surgery.  Staples will be removed at your post-op visit. You may leave the incision open to air.  You may replace a dressing/Band-Aid to cover the incision for comfort if you wish.  Continue to shower over incision(s) after the dressing is off. No lotions, ointments, rubbing or scrubbing the incision.  Your  bladder catheter and drain will stay in until you follow up with Dr Matilde Sprang (urologist)  7. ACTIVITIES as tolerated:   a. You may resume regular (light) daily activities beginning the next day--such as daily self-care, walking, climbing stairs--gradually increasing activities as tolerated.  If you can walk 30 minutes without difficulty, it is safe to try more intense activity such as jogging, treadmill, bicycling, low-impact aerobics, swimming, etc. b. Refrain from the most intensive and strenuous activity such as sit-ups, heavy lifting, contact sports, etc  Refrain from any heavy lifting or straining until 6 weeks after surgery.   c. DO NOT PUSH THROUGH PAIN.  Let pain be your guide: If it hurts to do something, don't do it.  Pain is your body warning you to avoid that activity for another week until the pain goes down. d. You may drive when you are no longer taking prescription pain medication, you can comfortably wear a seatbelt, and you can safely maneuver your car and apply brakes. e. Dennis Bast may have sexual intercourse when it is comfortable.  8. FOLLOW UP in our office a. Please call [facility] at [phone] to set up an appointment to see your surgeon in the office for a follow-up appointment approximately 2-3 weeks after your surgery. b. Make sure that you call for this appointment the day you arrive home to insure a convenient appointment time. 9.  IF YOU HAVE DISABILITY OR FAMILY LEAVE FORMS, BRING THEM TO THE OFFICE FOR PROCESSING.  DO NOT GIVE THEM TO YOUR DOCTOR.  WHEN TO CALL us [phone]: 1. Poor pain control 2. Reactions / problems with new medications (rash/itching, nausea, etc)  3. Fever over 101.5 F (38.5 C) 4. Inability to urinate 5. Nausea and/or vomiting 6. Worsening swelling or bruising 7. Continued bleeding from incision. 8. Increased pain, redness, or drainage from the incision   The clinic staff is available to answer your questions during regular business hours  (8:30am-5pm).  Please dont hesitate to call and ask to speak to one of our nurses for clinical concerns.   If you have a medical emergency, go to the nearest emergency room or call 911.  A surgeon from [facility] is always on call at the hospitals in [facility], [place], [place], [place],   [address] ?  [address] [address], [place],    [address] MAIN: [phone] ? TOLL FREE: [phone] ? FAX: [phone] [email or web address]   Indwelling Urinary Catheter Care, Adult Take good care of your catheter to keep it working and to prevent problems. How to wear your catheter Attach your catheter to your leg with tape (adhesive tape) or a leg strap. Make sure it is not too tight. If you use tape, remove any bits of tape that are already on the catheter. How to wear a drainage bag You should have:  A large overnight bag.  A small leg bag.  Overnight Bag You may wear the overnight bag at any time. Always keep the bag below the level of your bladder but off the floor. When you sleep, put a clean plastic bag in  a wastebasket. Then hang the bag inside the wastebasket. Leg Bag Never wear the leg bag at night. Always wear the leg bag below your knee. Keep the leg bag secure with a leg strap or tape. How to care for your skin  Clean the skin around the catheter at least once every day.  Shower every day. Do not take baths.  Put creams, lotions, or ointments on your genital area only as told by your doctor.  Do not use powders, sprays, or lotions on your genital area. How to clean your catheter and your skin 1. Wash your hands with soap and water. 2. Wet a washcloth in warm water and gentle (mild) soap. 3. Use the washcloth to clean the skin where the catheter enters your body. Clean downward and wipe away from the catheter in small circles. Do not wipe toward the catheter. 4. Pat the area dry with a clean towel.  Make sure to clean off all soap. How to care for your drainage bags Empty your drainage bag when it is ?- full or at least 2-3 times a day. Replace your drainage bag once a month or sooner if it starts to smell bad or look dirty. Do not clean your drainage bag unless told by your doctor. Emptying a drainage bag  Supplies Needed  Rubbing alcohol.  Gauze pad or cotton ball.  Tape or a leg strap.  Steps 1. Wash your hands with soap and water. 2. Separate (detach) the bag from your leg. 3. Hold the bag over the toilet or a clean container. Keep the bag below your hips and bladder. This stops pee (urine) from going back into the tube. 4. Open the pour spout at the bottom of the bag. 5. Empty the pee into the toilet or container. Do not let the pour spout touch any surface. 6. Put rubbing alcohol on a gauze pad or cotton ball. 7. Use the gauze pad or cotton ball to clean the pour spout. 8. Close the pour spout. 9. Attach the bag to your leg with tape or a leg strap. 10. Wash your hands.  Changing a drainage bag Supplies Needed  Alcohol wipes.  A clean drainage bag.  Adhesive tape or a leg strap.  Steps 1. Wash your hands with soap and water. 2. Separate the dirty bag from your leg. 3. Pinch the rubber catheter with your fingers so that pee does not spill out. 4. Separate the catheter tube from the drainage tube where these tubes connect (at the connection valve). Do not let the tubes touch any surface. 5. Clean the end of the catheter tube with an alcohol wipe. Use a different alcohol wipe to clean the end of the drainage tube. 6. Connect the catheter tube to the drainage tube of the clean bag. 7. Attach the new bag to the leg with adhesive tape or a leg strap. 8. Wash your hands.  How to prevent infection and other problems  Never pull on your catheter or try to remove it. Pulling can damage tissue in your body.  Always wash your hands before and after touching your  catheter.  If a leg strap gets wet, replace it with a dry one.  Drink enough fluids to keep your pee clear or pale yellow, or as told by your doctor.  Do not let the drainage bag or tubing touch the floor.  Wear cotton underwear.  If you are male, wipe from front to back after you poop (have a bowel  movement).  Check on the catheter often to make sure it works and the tubing is not twisted. Get help if:  Your pee is cloudy.  Your pee smells unusually bad.  Your pee is not draining into the bag.  Your tube gets clogged.  Your catheter starts to leak.  Your bladder feels full. Get help right away if:  You have redness, swelling, or pain where the catheter enters your body.  You have fluid, pus, or a bad smell coming from the area where the catheter enters your body.  The area where the catheter enters your body feels warm.  You have a fever.  You have pain in your: ? Stomach (abdomen). ? Legs. ? Lower back. ? Bladder.  You see blood fill the catheter.  Your pee is pink or red.  You feel sick to your stomach (nauseous).  You throw up (vomit).  You have chills.  Your catheter gets pulled out. This information is not intended to replace advice given to you by your health care provider. Make sure you discuss any questions you have with your health care provider. Document Released: 07/16/2012 Document Revised: 02/17/2016 Document Reviewed: 09/03/2013 Elsevier Interactive Patient Education  2018 [place] Surgical drains are used to remove extra fluid that normally builds up in a surgical wound after surgery. A surgical drain helps to heal a surgical wound. Different kinds of surgical drains include:  Active drains. These drains use suction to pull drainage away from the surgical wound. Drainage flows through a tube to a container outside of the body. It is important to keep the bulb or the drainage container flat (compressed)  at all times, except while you empty it. Flattening the bulb or container creates suction. The two most common types of active drains are bulb drains and Hemovac drains.  Passive drains. These drains allow fluid to drain naturally, by gravity. Drainage flows through a tube to a bandage (dressing) or a container outside of the body. Passive drains do not need to be emptied. The most common type of passive drain is the Penrose drain.  A drain is placed during surgery. Immediately after surgery, drainage is usually bright red and a little thicker than water. The drainage may gradually turn yellow or pink and become thinner. It is likely that your health care provider will remove the drain when the drainage stops or when the amount decreases to 1-2 Tbsp (15-30 mL) during a 24-hour period. How to care for your surgical drain  Keep the skin around the drain dry and covered with a dressing at all times.  Check your drain area every day for signs of infection. Check for: ? More redness, swelling, or pain. ? Pus or a bad smell. ? Cloudy drainage. Follow instructions from your health care provider about how to take care of your drain and how to change your dressing. Change your dressing at least one time every day. Change it more often if needed to keep the dressing dry. Make sure you: 1. Gather your supplies, including: ? Tape. ? Germ-free cleaning solution (sterile saline). ? Split gauze drain sponge: 4 x 4 inches (10 x 10 cm). ? Gauze square: 4 x 4 inches (10 x 10 cm). 2. Wash your hands with soap and water before you change your dressing. If soap and water are not available, use hand sanitizer. 3. Remove the old dressing. Avoid using scissors to do that. 4. Use sterile saline to  clean your skin around the drain. 5. Place the tube through the slit in a drain sponge. Place the drain sponge so that it covers your wound. 6. Place the gauze square or another drain sponge on top of the drain sponge that  is on the wound. Make sure the tube is between those layers. 7. Tape the dressing to your skin. 8. If you have an active bulb or Hemovac drain, tape the drainage tube to your skin 1-2 inches (2.5-5 cm) below the place where the tube enters your body. Taping keeps the tube from pulling on any stitches (sutures) that you have. 9. Wash your hands with soap and water. 10. Write down the color of your drainage and how often you change your dressing.  How to empty your active bulb or Hemovac drain 1. Make sure that you have a measuring cup that you can empty your drainage into. 2. Wash your hands with soap and water. If soap and water are not available, use hand sanitizer. 3. Gently move your fingers down the tube while squeezing very lightly. This is called stripping the tube. This clears any drainage, clots, or tissue from the tube. ? Do not pull on the tube. ? You may need to strip the tube several times every day to keep the tube clear. 4. Open the bulb cap or the drain plug. Do not touch the inside of the cap or the bottom of the plug. 5. Empty all of the drainage into the measuring cup. 6. Compress the bulb or the container and replace the cap or the plug. To compress the bulb or the container, squeeze it firmly in the middle while you close the cap or plug the container. 7. Write down the amount of drainage that you have in each 24-hour period. If you have less than 2 Tbsp (30 mL) of drainage during 24 hours, contact your health care provider. 8. Flush the drainage down the toilet. 9. Wash your hands with soap and water. Contact a health care provider if:  You have more redness, swelling, or pain around your drain area.  The amount of drainage that you have is increasing instead of decreasing.  You have pus or a bad smell coming from your drain area.  You have a fever.  You have drainage that is cloudy.  There is a sudden stop or a sudden decrease in the amount of drainage that you  have.  Your tube falls out.  Your active draindoes not stay compressedafter you empty it. This information is not intended to replace advice given to you by your health care provider. Make sure you discuss any questions you have with your health care provider. Document Released: 03/18/2000 Document Revised: 08/27/2015 Document Reviewed: 10/08/2014 Elsevier Interactive Patient Education  2018 [employer].

## 2017-04-21 NOTE — Anesthesia Procedure Notes (Signed)
Procedure Name: Intubation Date/Time: 04/21/2017 7:45 AM Performed by: Lowella Dell, CRNA Pre-anesthesia Checklist: Patient identified, Emergency Drugs available, Suction available and Patient being monitored Patient Re-evaluated:Patient Re-evaluated prior to induction Oxygen Delivery Method: Circle System Utilized Preoxygenation: Pre-oxygenation with 100% oxygen Induction Type: IV induction Ventilation: Mask ventilation without difficulty and Oral airway inserted - appropriate to patient size Laryngoscope Size: Mac and 3 (with bougie) Grade View: Grade III Tube type: Oral Tube size: 7.5 mm Number of attempts: 1 Airway Equipment and Method: Stylet and Bougie stylet Placement Confirmation: ETT inserted through vocal cords under direct vision,  positive ETCO2 and breath sounds checked- equal and bilateral Secured at: 21 cm Tube secured with: Tape Dental Injury: Teeth and Oropharynx as per pre-operative assessment  Difficulty Due To: Difficult Airway- due to limited oral opening and Difficult Airway- due to anterior larynx

## 2017-04-21 NOTE — H&P (Signed)
Stanley Whitney DOB: October 03, 1960 Married / Language: English / Race: White Male  History of Present Illness Patient words: very pleasant 57 year old gentleman presents with a right inguinal hernia which he noticed several weeks ago. every year in the winter time he develops a chronic nonproductive cough which he thinks is associated with getting a flu shot, and this year during this bout of coughing is when he first noticed the bulge in his right groin. He states that it swells and goes down depending on time of day and activity. It is causing some discomfort but is not particularly painful. He denies urinary symptoms, denies nausea vomiting bloating or constipation. He has never had any abdominal surgery and no prior hernia surgery. Does not smoke. 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 He states he also has hernias on both sides of his abdomen around the level of the umbilicus which had been there for 3 years.   Diagnostic Studies History Colonoscopy within last year  Allergies  No Known Allergies 03/10/2017 Allergies Reconciled  Medication History  Virtussin A/C (100-10MG /5ML Solution, Oral) Active. Atenolol (25MG  Tablet, Oral) Active. Rosuvastatin Calcium (5MG  Tablet, Oral) Active. HydroCHLOROthiazide (12.5MG  Tablet, Oral) Active. MethylPREDNISolone (4MG  Tab Ther Pack, Oral) Active. Fish Oil Burp-Less (1000MG  Capsule, Oral) Active. Medications Reconciled  Social History  Alcohol use Occasional alcohol use. Caffeine use Carbonated beverages, Coffee, Tea. No drug use Tobacco use Former smoker.  Family History  Breast Cancer Mother.  Other Problems  Back Pain Gastroesophageal Reflux Disease High blood pressure Hypercholesterolemia Inguinal Hernia   Review of Systems  General Present- Appetite Loss. Not Present- Chills, Fatigue, Fever, Night Sweats, Weight Gain and Weight Loss. Skin Not Present-  Change in Wart/Mole, Dryness, Hives, Jaundice, New Lesions, Non-Healing Wounds, Rash and Ulcer. HEENT Present- Ringing in the Ears, Seasonal Allergies, Sinus Pain, Sore Throat and Wears glasses/contact lenses. Not Present- Earache, Hearing Loss, Hoarseness, Nose Bleed, Oral Ulcers, Visual Disturbances and Yellow Eyes. Respiratory Present- Chronic Cough, Difficulty Breathing, Snoring and Wheezing. Not Present- Bloody sputum. Breast Not Present- Breast Mass, Breast Pain, Nipple Discharge and Skin Changes. Cardiovascular Not Present- Chest Pain, Difficulty Breathing Lying Down, Leg Cramps, Palpitations, Rapid Heart Rate, Shortness of Breath and Swelling of Extremities. Gastrointestinal Not Present- Abdominal Pain, Bloating, Bloody Stool, Change in Bowel Habits, Chronic diarrhea, Constipation, Difficulty Swallowing, Excessive gas, Gets full quickly at meals, Hemorrhoids, Indigestion, Nausea, Rectal Pain and Vomiting. Male Genitourinary Not Present- Blood in Urine, Change in Urinary Stream, Frequency, Impotence, Nocturia, Painful Urination, Urgency and Urine Leakage. Musculoskeletal Present- Back Pain, Joint Pain, Joint Stiffness, Muscle Pain, Muscle Weakness and Swelling of Extremities. Neurological Not Present- Decreased Memory, Fainting, Headaches, Numbness, Seizures, Tingling, Tremor, Trouble walking and Weakness. Psychiatric Present- Depression. Not Present- Anxiety, Bipolar, Change in Sleep Pattern, Fearful and Frequent crying. Endocrine Not Present- Cold Intolerance, Excessive Hunger, Hair Changes, Heat Intolerance, Hot flashes and New Diabetes. Hematology Not Present- Blood Thinners, Easy Bruising, Excessive bleeding, Gland problems, HIV and Persistent Infections.  Vitals:   04/21/17 0552 04/21/17 0631  BP: (!) 153/100 (!) 150/99  Pulse: 61   Resp: 19   Temp: 97.8 F (36.6 C)   SpO2: 97%        Physical Exam  General Note: alert and well appearing  Integumentary Note: warm  and dry  Head and Neck Note: no mass or thyromegaly  Eye Note: No scleral icterus. Extra ocular motions intact.  ENMT Note: Moist mucous membranes, dentition intact  Chest and  Lung Exam Note: Unlabored respirations, clear bilaterally  Cardiovascular Note: Regular rate and rhythm, no pedal edema  Abdomen Note: Soft, obese, nontender nondistended. No mass or organomegaly. there is a large right inguinal hernia present. No left internal hernia, no umbilical hernia. I do not appreciate a hernia where he is describing a bulge on either side of his abdomen  Neurologic Note: Grossly intact, normal gait  Neuropsychiatric Note: Normal mood and affect. Appropriate insight.  Musculoskeletal Note: Strength symmetrical throughout, no deformity    Assessment & Plan   RIGHT INGUINAL HERNIA (K40.90) Story: we discussed the natural history of hernias and the anatomy of the inguinal region. I recommended open repair with mesh we discussed the procedure in detail including risks of bleeding, infection, pain, scarring, injury to structures such as the gonadal vessels, vas deferens, or nerves in the area and resultant risk of chronic pain or numbness. Discussed risk of hernia recurrence. Questions were welcomed and answered.

## 2017-04-21 NOTE — Progress Notes (Signed)
Looks good  Bladder draining well No obvious bladder spasms OP findings explained Post op f/up explained Phone number given- will follow

## 2017-04-21 NOTE — Transfer of Care (Signed)
Immediate Anesthesia Transfer of Care Note  Patient: SIAH STEELY  Procedure(s) Performed: OPEN RIGHT INGUINAL HERNIA REPAIR WITH MESH (Right Inguinal) INSERTION OF MESH; RIGHT INGUINAL (Right Inguinal) REPAIR BLADDER CYSTOTOMY (N/A Abdomen)  Patient Location: PACU  Anesthesia Type:General  Level of Consciousness: awake, alert , oriented and patient cooperative  Airway & Oxygen Therapy: Patient Spontanous Breathing and Patient connected to nasal cannula oxygen  Post-op Assessment: Report given to RN and Post -op Vital signs reviewed and stable  Post vital signs: Reviewed and stable  Last Vitals:  Vitals:   04/21/17 0730 04/21/17 1209  BP:  127/71  Pulse: 70 76  Resp: 10 18  Temp:  36.9 C  SpO2: 97% 96%    Last Pain:  Vitals:   04/21/17 0552  TempSrc: Oral         Complications: No apparent anesthesia complications

## 2017-04-22 ENCOUNTER — Encounter (HOSPITAL_COMMUNITY): Payer: Self-pay | Admitting: *Deleted

## 2017-04-22 DIAGNOSIS — K4090 Unilateral inguinal hernia, without obstruction or gangrene, not specified as recurrent: Secondary | ICD-10-CM | POA: Diagnosis not present

## 2017-04-22 LAB — CBC
HEMATOCRIT: 41.5 % (ref 39.0–52.0)
HEMOGLOBIN: 13.8 g/dL (ref 13.0–17.0)
MCH: 30.7 pg (ref 26.0–34.0)
MCHC: 33.3 g/dL (ref 30.0–36.0)
MCV: 92.4 fL (ref 78.0–100.0)
Platelets: 215 10*3/uL (ref 150–400)
RBC: 4.49 MIL/uL (ref 4.22–5.81)
RDW: 14 % (ref 11.5–15.5)
WBC: 19.3 10*3/uL — ABNORMAL HIGH (ref 4.0–10.5)

## 2017-04-22 LAB — BASIC METABOLIC PANEL
ANION GAP: 11 (ref 5–15)
BUN: 14 mg/dL (ref 6–20)
CALCIUM: 8.6 mg/dL — AB (ref 8.9–10.3)
CHLORIDE: 102 mmol/L (ref 101–111)
CO2: 25 mmol/L (ref 22–32)
Creatinine, Ser: 1.18 mg/dL (ref 0.61–1.24)
GFR calc non Af Amer: >60 mL/min (ref >60)
Glucose, Bld: 125 mg/dL — ABNORMAL HIGH (ref 65–99)
POTASSIUM: 4.5 mmol/L (ref 3.5–5.1)
Sodium: 138 mmol/L (ref 135–145)

## 2017-04-22 LAB — HIV ANTIBODY (ROUTINE TESTING W REFLEX): HIV Screen 4th Generation wRfx: NONREACTIVE

## 2017-04-22 MED ORDER — MENTHOL 3 MG MT LOZG
1.0000 | LOZENGE | OROMUCOSAL | Status: DC | PRN
  Administered 2017-04-22: 3 mg via ORAL
  Filled 2017-04-22: qty 9

## 2017-04-22 NOTE — Progress Notes (Signed)
Discharge home. Home discharge  Instruction given, no question verbalized.

## 2017-04-22 NOTE — Care Management Note (Signed)
Case Management Note  Patient Details  Name: Stanley Whitney MRN: [id number] Date of Birth: 12/21/1960  Subjective/Objective:                 Patient with order to DC to home today. Chart reviewed. No Home Health or Equipment needs, no unacknowledged Case Management consults or medication needs identified at the time of this note. Plan for DC to home.  CM signing off. If new needs arise today prior to discharge, please call Carles Collet RN CM at [phone].   Action/Plan:   Expected Discharge Date:  04/22/17               Expected Discharge Plan:  Home/Self Care  In-House Referral:     Discharge planning Services  CM Consult  Post Acute Care Choice:    Choice offered to:     DME Arranged:    DME Agency:     HH Arranged:    HH Agency:     Status of Service:  Completed, signed off  If discussed at [employer] of Stay Meetings, dates discussed:    Additional Comments:  Carles Collet, RN 04/22/2017, 9:32 AM

## 2017-04-22 NOTE — Discharge Summary (Signed)
Physician Discharge Summary  Patient ID: Stanley Whitney MRN: [id number] DOB/AGE: 1960/09/03 57 y.o.  Admit date: 04/21/2017 Discharge date: 04/22/2017  Admission Diagnoses: Status post ex lap, right inguinal hernia repair with mesh, bladder repair  Discharge Diagnoses:  Active Problems:   S/P bladder repair Status post hernia repair  Discharged Condition: good  Hospital Course: Patient underwent right inguinal hernia repair with mesh, laparotomy and bladder repair.  Please see operative note for full details.  Postoperative patient was sent to the floor.  He was on a liquid diet and advance to a regular diet which she was able to tolerate well.  Patient had good pain control on day of discharge.  Patient was ambulating well on his own throughout the floor.  Patient had Foley in place.  This was being managed by urology.  He will be discharged home with his Foley and follow-up with urology as scheduled.  Patient otherwise afebrile, deemed stable for discharge and discharged home.  Consults: urology  Significant Diagnostic Studies: none  Treatments: surgery: as above  Discharge Exam: Blood pressure 108/63, pulse 74, temperature 98.1 F (36.7 C), temperature source Oral, resp. rate 17, SpO2 99 %. General appearance: alert and cooperative GI: soft, non-tender; bowel sounds normal; no masses,  no organomegaly and inc c/d/i, Jp SS  Disposition: 01-Home or Self Care  Discharge Instructions    Diet - low sodium heart healthy   Complete by:  As directed    Increase activity slowly   Complete by:  As directed      Allergies as of 04/22/2017   No Known Allergies     Medication List    STOP taking these medications   doxycycline 100 MG tablet Commonly known as:  VIBRA-TABS     TAKE these medications   atenolol 25 MG tablet Commonly known as:  TENORMIN Take 25 mg by mouth daily.   docusate sodium 100 MG capsule Commonly known as:  COLACE Take 1 capsule (100 mg total) by  mouth 2 (two) times daily.   Fish Oil 1000 MG Caps Take 2 capsules by mouth daily.   hydrochlorothiazide 25 MG tablet Commonly known as:  HYDRODIURIL Take 25 mg by mouth daily.   naproxen sodium 220 MG tablet Commonly known as:  ALEVE Take 440 mg by mouth daily as needed (pain).   oxyCODONE-acetaminophen 5-325 MG tablet Commonly known as:  PERCOCET/ROXICET Take 1 tablet by mouth every 6 (six) hours as needed for severe pain.   rosuvastatin 5 MG tablet Commonly known as:  CRESTOR Take 5 mg by mouth every other day.      Follow-up Information    Clovis Riley, MD Follow up in 3 week(s).   Specialty:  General Surgery Contact information: [phone]        Bjorn Loser, MD. Schedule an appointment as soon as possible for a visit in 2 week(s).   Specialty:  Urology Contact information: [place] [address] [phone]        Bjorn Loser, MD Follow up.   Specialty:  Urology Contact information: [place] [address] [phone]           Signed: Rosario Jacks., Anne Hahn 04/22/2017, 9:03 AM

## 2017-04-24 ENCOUNTER — Encounter (HOSPITAL_COMMUNITY): Payer: Self-pay | Admitting: Surgery

## 2017-04-28 ENCOUNTER — Other Ambulatory Visit: Payer: Self-pay | Admitting: Urology

## 2017-04-28 DIAGNOSIS — S3723XA Laceration of bladder, initial encounter: Secondary | ICD-10-CM

## 2017-05-01 ENCOUNTER — Ambulatory Visit (INDEPENDENT_AMBULATORY_CARE_PROVIDER_SITE_OTHER): Payer: BLUE CROSS/BLUE SHIELD | Admitting: Family Medicine

## 2017-05-01 DIAGNOSIS — Z9889 Other specified postprocedural states: Secondary | ICD-10-CM

## 2017-05-01 NOTE — Progress Notes (Signed)
Suture removal

## 2017-05-04 ENCOUNTER — Ambulatory Visit (HOSPITAL_COMMUNITY)
Admission: RE | Admit: 2017-05-04 | Discharge: 2017-05-04 | Disposition: A | Discharge status: Home / Self Care | Payer: BLUE CROSS/BLUE SHIELD | Source: Ambulatory Visit | Attending: Urology | Admitting: Urology

## 2017-05-04 DIAGNOSIS — X58XXXA Exposure to other specified factors, initial encounter: Secondary | ICD-10-CM | POA: Diagnosis not present

## 2017-05-04 DIAGNOSIS — S3723XA Laceration of bladder, initial encounter: Secondary | ICD-10-CM | POA: Insufficient documentation

## 2017-12-15 ENCOUNTER — Ambulatory Visit: Payer: BLUE CROSS/BLUE SHIELD | Admitting: Gastroenterology

## 2018-01-17 ENCOUNTER — Encounter

## 2018-01-17 ENCOUNTER — Ambulatory Visit: Payer: BLUE CROSS/BLUE SHIELD | Admitting: Gastroenterology

## 2018-09-17 DIAGNOSIS — I10 Essential (primary) hypertension: Secondary | ICD-10-CM | POA: Diagnosis not present

## 2018-09-17 DIAGNOSIS — N486 Induration penis plastica: Secondary | ICD-10-CM | POA: Diagnosis not present

## 2018-09-17 DIAGNOSIS — E785 Hyperlipidemia, unspecified: Secondary | ICD-10-CM | POA: Diagnosis not present

## 2018-09-19 DIAGNOSIS — I10 Essential (primary) hypertension: Secondary | ICD-10-CM | POA: Diagnosis not present

## 2018-09-19 DIAGNOSIS — E291 Testicular hypofunction: Secondary | ICD-10-CM | POA: Diagnosis not present

## 2018-09-19 DIAGNOSIS — E785 Hyperlipidemia, unspecified: Secondary | ICD-10-CM | POA: Diagnosis not present

## 2018-10-11 DIAGNOSIS — F524 Premature ejaculation: Secondary | ICD-10-CM | POA: Diagnosis not present

## 2018-10-11 DIAGNOSIS — N486 Induration penis plastica: Secondary | ICD-10-CM | POA: Diagnosis not present

## 2018-10-11 DIAGNOSIS — N5201 Erectile dysfunction due to arterial insufficiency: Secondary | ICD-10-CM | POA: Diagnosis not present

## 2018-10-22 DIAGNOSIS — N181 Chronic kidney disease, stage 1: Secondary | ICD-10-CM | POA: Diagnosis not present

## 2018-10-24 DIAGNOSIS — R7302 Impaired glucose tolerance (oral): Secondary | ICD-10-CM | POA: Diagnosis not present

## 2018-10-24 DIAGNOSIS — E785 Hyperlipidemia, unspecified: Secondary | ICD-10-CM | POA: Diagnosis not present

## 2018-10-24 DIAGNOSIS — I10 Essential (primary) hypertension: Secondary | ICD-10-CM | POA: Diagnosis not present

## 2018-12-17 DIAGNOSIS — E785 Hyperlipidemia, unspecified: Secondary | ICD-10-CM | POA: Diagnosis not present

## 2018-12-17 DIAGNOSIS — R7302 Impaired glucose tolerance (oral): Secondary | ICD-10-CM | POA: Diagnosis not present

## 2018-12-17 DIAGNOSIS — E291 Testicular hypofunction: Secondary | ICD-10-CM | POA: Diagnosis not present

## 2018-12-19 DIAGNOSIS — I10 Essential (primary) hypertension: Secondary | ICD-10-CM | POA: Diagnosis not present

## 2018-12-19 DIAGNOSIS — M5432 Sciatica, left side: Secondary | ICD-10-CM | POA: Diagnosis not present

## 2018-12-19 DIAGNOSIS — E785 Hyperlipidemia, unspecified: Secondary | ICD-10-CM | POA: Diagnosis not present

## 2019-01-22 DIAGNOSIS — M545 Low back pain: Secondary | ICD-10-CM | POA: Diagnosis not present

## 2019-01-22 DIAGNOSIS — M5416 Radiculopathy, lumbar region: Secondary | ICD-10-CM | POA: Diagnosis not present

## 2019-02-07 DIAGNOSIS — M5416 Radiculopathy, lumbar region: Secondary | ICD-10-CM | POA: Diagnosis not present

## 2019-02-07 DIAGNOSIS — M545 Low back pain: Secondary | ICD-10-CM | POA: Diagnosis not present

## 2019-02-18 DIAGNOSIS — Z0189 Encounter for other specified special examinations: Secondary | ICD-10-CM | POA: Diagnosis not present

## 2019-02-18 DIAGNOSIS — M5416 Radiculopathy, lumbar region: Secondary | ICD-10-CM | POA: Diagnosis not present

## 2019-02-18 DIAGNOSIS — M545 Low back pain: Secondary | ICD-10-CM | POA: Diagnosis not present

## 2019-02-19 DIAGNOSIS — M5416 Radiculopathy, lumbar region: Secondary | ICD-10-CM | POA: Diagnosis not present

## 2019-02-21 ENCOUNTER — Encounter: Payer: Self-pay | Admitting: Surgery

## 2019-02-21 ENCOUNTER — Ambulatory Visit: Payer: BLUE CROSS/BLUE SHIELD | Admitting: Surgery

## 2019-02-21 ENCOUNTER — Other Ambulatory Visit: Payer: Self-pay

## 2019-02-21 VITALS — BP 149/90 | HR 65 | Temp 98.6°F | Resp 12 | Ht 68.0 in | Wt 200.0 lb

## 2019-02-21 DIAGNOSIS — L723 Sebaceous cyst: Secondary | ICD-10-CM | POA: Diagnosis not present

## 2019-02-21 DIAGNOSIS — M7710 Lateral epicondylitis, unspecified elbow: Secondary | ICD-10-CM | POA: Insufficient documentation

## 2019-02-21 NOTE — Progress Notes (Signed)
Patient ID: Stanley Whitney, male   DOB: Apr 04, 1961, 58 y.o.   MRN: [id number]  Chief Complaint: Skin lesions x3  History of Present Illness Stanley Whitney is a 58 y.o. male with progressively enlarging skin lesions/lumps on his's back.  Prior history of excision of inflamed or infected mass on his back years ago.  Denies discharge, inflammation or change in color.  Reports they have increased in size, quite slowly and have become somewhat raised.  Desires to proceed with removal for fear of potential rupture/infection.  Would like all 3 removed in the same setting and proceed as an outpatient.  Past Medical History Past Medical History:  Diagnosis Date  . Bilateral incarcerated inguinal hernia   . Heartburn   . History of kidney stones   . HLD (hyperlipidemia)   . Hypertension   . Kidney stone       Past Surgical History:  Procedure Laterality Date  . BLADDER REPAIR N/A 04/21/2017   Procedure: REPAIR BLADDER CYSTOTOMY;  Surgeon: Clovis Riley, MD;  Location: [place];  Service: General;  Laterality: N/A;  . COLONOSCOPY WITH PROPOFOL N/A 06/16/2016   Procedure: COLONOSCOPY WITH PROPOFOL;  Surgeon: [name], MD;  Location: [facility] ENDOSCOPY;  Service: Endoscopy;  Laterality: N/A;  . COLONOSCOPY WITH PROPOFOL N/A 10/04/2016   Procedure: COLONOSCOPY WITH PROPOFOL;  Surgeon: [name], MD;  Location: [facility] END[facility]COPY;  Service: Endoscopy;  Laterality: N/A;  . CYST REMOVAL TRUNK     Back  . EYE SURGERY     lasix  . INGUINAL HERNIA REPAIR Right 04/21/2017   Procedure: OPEN RIGHT INGUINAL HERNIA REPAIR WITH MESH;  Surgeon: Clovis Riley, MD;  Location: [place];  Service: General;  Laterality: Right;  . INSERTION OF MESH Right 04/21/2017   Procedure: INSERTION OF MESH; RIGHT INGUINAL;  Surgeon: Clovis Riley, MD;  Location: [place];  Service: General;  Laterality: Right;  . none      No Known Allergies  Current Outpatient Medications  Medication Sig Dispense Refill  . atenolol (TENORMIN)  25 MG tablet Take 25 mg by mouth daily.    . hydrochlorothiazide (HYDRODIURIL) 25 MG tablet Take 25 mg by mouth daily.    . naproxen sodium (ALEVE) 220 MG tablet Take 440 mg by mouth daily as needed (pain).    . Omega-3 Fatty Acids (FISH OIL) 1000 MG CAPS Take 2 capsules by mouth daily.    . rosuvastatin (CRESTOR) 5 MG tablet Take 5 mg by mouth every other day.      No current facility-administered medications for this visit.     Family History Family History  Problem Relation Age of Onset  . Kidney disease Neg Hx   . Prostate cancer Neg Hx       Social History Social History   Tobacco Use  . Smoking status: Former Research scientist (life sciences)  . Smokeless tobacco: Former Systems developer  . Tobacco comment: quit over 30 years  Substance Use Topics  . Alcohol use: Yes    Comment: social  . Drug use: No        Review of Systems  Constitutional: Negative.   Respiratory: Negative.   Cardiovascular: Negative.   Gastrointestinal: Negative.   Skin: Negative for itching and rash.      Physical Exam Blood pressure (!) 149/90, pulse 65, temperature 98.6 F (37 C), temperature source Temporal, resp. rate 12, height 5\' 8"  (1.727 m), weight 200 lb (90.7 kg), SpO2 96 %. Last Weight  Most recent update: 02/21/2019 10:10  AM   Weight  90.7 kg (200 lb)            CONSTITUTIONAL: Well developed, and nourished, appropriately responsive and aware without distress.   EYES: Sclera non-icteric.   EARS, NOSE, MOUTH AND THROAT: Mask worn.   NECK: Trachea is midline, and there is no jugular venous distension.  LYMPH NODES:  Lymph nodes in the neck are not enlarged. RESPIRATORY:  Lungs are clear, and breath sounds are equal bilaterally. Normal respiratory effort without pathologic use of accessory muscles. CARDIOVASCULAR: Heart is regular in rate and rhythm. GI: The abdomen is soft, nontender, and nondistended. TMUSCULOSKELETAL:  Symmetrical muscle tone appreciated in all four extremities.    SKIN: Skin turgor is  normal. No pathologic skin lesions appreciated.  There are 3 raised masses of approximately 1-1/2 to 2 cm in size from mid back to right parascapular area.  2 of them I can identify a punctum.  None are fluctuant or inflamed at present. NEUROLOGIC:  Motor and sensation appear grossly normal.  Cranial nerves are grossly without defect. PSYCH:  Alert and oriented to person, place and time. Affect is appropriate for situation.  Data Reviewed I have personally reviewed what is currently available of the patient's imaging, recent labs and medical records.      Assessment    Multiple raised symptomatic sebaceous cysts of the back.   Patient Active Problem List   Diagnosis Date Noted  . Lateral epicondylitis 02/21/2019  . S/P bladder repair 04/21/2017  . Polyp of sigmoid colon   . Benign neoplasm of descending colon   . Special screening for malignant neoplasms, colon   . Benign neoplasm of ascending colon   . Screening for colon cancer   . Benign neoplasm of cecum   . Tear of medial meniscus of knee 02/22/2016    Plan    Will schedule another office visit to enable [place] to excise multiple lesions in one sitting.  We discussed local anesthesia and he is happy to pursue this without any additional sedation.  He wishes to have this done before the end of the year.  Risks and benefits of the procedure include but are not limited to anesthesia, bleeding, infection, recurrence, additional or unexpected scarring.  Questions answered, he desires to proceed without any guarantees implied.  Face-to-face time spent with the patient and accompanying care providers(if present) was 20 minutes, with more than 50% of the time spent counseling, educating, and coordinating care of the patient.      Ronny Bacon 02/21/2019, 11:48 AM

## 2019-02-21 NOTE — Patient Instructions (Signed)
Please see your follow up appointment listed below.  °

## 2019-03-06 DIAGNOSIS — M47816 Spondylosis without myelopathy or radiculopathy, lumbar region: Secondary | ICD-10-CM | POA: Diagnosis not present

## 2019-03-07 ENCOUNTER — Other Ambulatory Visit: Payer: Self-pay

## 2019-03-07 ENCOUNTER — Ambulatory Visit (INDEPENDENT_AMBULATORY_CARE_PROVIDER_SITE_OTHER): Payer: BC Managed Care – PPO | Admitting: Surgery

## 2019-03-07 ENCOUNTER — Encounter: Payer: Self-pay | Admitting: Surgery

## 2019-03-07 VITALS — BP 157/81 | HR 60 | Temp 97.2°F | Resp 12 | Ht 68.0 in | Wt 208.8 lb

## 2019-03-07 DIAGNOSIS — L720 Epidermal cyst: Secondary | ICD-10-CM | POA: Diagnosis not present

## 2019-03-07 DIAGNOSIS — L723 Sebaceous cyst: Secondary | ICD-10-CM | POA: Diagnosis not present

## 2019-03-07 NOTE — Progress Notes (Signed)
Sebaceous cysts of right upper back/parascapular area, 2 of  2.5 cm, 1 of 2.1 cm  Pre-operative Diagnosis: Sebaceous cysts upper back right parascapular area.  Post-operative Diagnosis: same  Surgeon: Ronny Bacon, M.D., FACS  Anesthesia: Local   Findings: As expected consistent with chronic sebaceous cysts and scar tissue.  Estimated Blood Loss: 10 mL                 Specimens: Skin and subcutaneous tissues with associated sebaceous cysts.  Sent for permanent section          Complications: none              Procedure Details  The benefits, complications, treatment options, and expected outcomes were discussed with the patient. The risks of bleeding, infection, recurrence of symptoms, failure to resolve symptoms, unanticipated injury, prosthetic placement, prosthetic infection, any of which could require further surgery were reviewed with the patient. The likelihood of improving the patient's symptoms with return to their baseline status is good.  The patient and/or family concurred with the proposed plan, giving informed consent.  The patient was taken to Operating Room, identified and the procedure verified.  A Time Out was held and the above information confirmed. The patient was positioned in the prone position. The right upper back and parascapular area was prepped with Chloraprep and draped in the sterile fashion.  Sequentially each cyst was infiltrated with 1% lidocaine with epinephrine, each cyst was elliptically excised along the axis of the skin lines, and each cyst was excised in its entirety involving the extension into the subcutaneous spaces.  Hemostasis obtained with application of hemostats.  The deep dermis was approximated with interrupted 3-0 Vicryl sutures.  The skin closed with a running 4-0 Monocryl.  The skin sealed with Dermabond. There were two 2-1/2 cm cysts and one 2.1 cm.  Patient tolerated the procedure well and will return in 1 week for wound check.  All  specimens were placed in a single container and sent to pathology.         Ronny Bacon

## 2019-03-07 NOTE — Patient Instructions (Addendum)
We have removed three cysts from your back. We have placed sutures under the skin and surgical glue over the top. The sutures will dissolve over the next few months.  You may shower tomorrow. Do not scrub the area. Do not place any ointments or creams over the areas.  You may use ice to the area for the next 24 hours. You may use Tylenol or Ibuprofen as needed for pain control.  Please call us if you have excessive bleeding(bleeding that does not stop after applying pressure for 10 minutes), any signs of infection, or uncontrolled pain.   Follow up here in 1 week for a wound check. We will call you with your results.

## 2019-03-14 ENCOUNTER — Other Ambulatory Visit: Payer: Self-pay

## 2019-03-14 ENCOUNTER — Encounter: Payer: Self-pay | Admitting: Surgery

## 2019-03-14 ENCOUNTER — Ambulatory Visit (INDEPENDENT_AMBULATORY_CARE_PROVIDER_SITE_OTHER): Payer: BC Managed Care – PPO | Admitting: Surgery

## 2019-03-14 VITALS — BP 145/83 | HR 74 | Temp 97.9°F | Resp 14 | Ht 68.0 in | Wt 208.8 lb

## 2019-03-14 DIAGNOSIS — L723 Sebaceous cyst: Secondary | ICD-10-CM

## 2019-03-14 NOTE — Patient Instructions (Signed)
Please call our office if you have any questions or concerns.  

## 2019-03-14 NOTE — Progress Notes (Signed)
Surgical Clinic Progress/Follow-up Note   HPI:  58 y.o. Male presents to clinic for first post-op follow-up. Surgery was excision of dermal cyst from upper right back. Patient reports near-complete resolution of pre-and most peri-operative pain and has been tolerating regular diet with +flatus and normal BM's, denies N/V, fever/chills, CP, or SOB.  Review of Systems:  Constitutional: denies fever/chills  ENT: denies sore throat, hearing problems  Respiratory: denies shortness of breath, wheezing  Cardiovascular: denies chest pain, palpitations  Gastrointestinal: denies any unexpected abdominal pain, N/V, nor diarrhea or bowel dysfunction as per interval history Skin: Denies any other rashes or skin discolorations except post-surgical wounds as per interval history  Vital Signs:  BP (!) 145/83   Pulse 74   Temp 97.9 F (36.6 C)   Resp 14   Ht 5\' 8"  (1.727 m)   Wt 208 lb 12.8 oz (94.7 kg)   SpO2 96%   BMI 31.75 kg/m    Physical Exam:    -- Wounds: The upper back parascapular post-surgical incisions healing nicely with peeling Dermabond.    Assessment:  58 y.o. yo Male with a problem list including...  Patient Active Problem List   Diagnosis Date Noted  . Lateral epicondylitis 02/21/2019  . Sebaceous cyst 02/21/2019  . S/P bladder repair 04/21/2017  . Polyp of sigmoid colon   . Benign neoplasm of descending colon   . Special screening for malignant neoplasms, colon   . Benign neoplasm of ascending colon   . Screening for colon cancer   . Benign neoplasm of cecum   . Tear of medial meniscus of knee 02/22/2016    presents to clinic for post-op follow-up evaluation, doing well.   Plan:                           - okay to submerge incisions under water (baths, swimming) prn             -  apply sunblock particularly to incisions with sun exposure to reduce pigmentation of scars             - return to clinic as needed, instructed to call office if any questions or  concerns  All of the above recommendations were discussed with the patient and all of patient's questions were answered to his expressed satisfaction.  Ronny Bacon, MD, FACS [place]: [place] [facility] exceptional care. Office: [phone]

## 2019-03-27 DIAGNOSIS — M5416 Radiculopathy, lumbar region: Secondary | ICD-10-CM | POA: Diagnosis not present

## 2019-04-01 DIAGNOSIS — E785 Hyperlipidemia, unspecified: Secondary | ICD-10-CM | POA: Diagnosis not present

## 2019-04-03 DIAGNOSIS — M5432 Sciatica, left side: Secondary | ICD-10-CM | POA: Diagnosis not present

## 2019-04-03 DIAGNOSIS — E785 Hyperlipidemia, unspecified: Secondary | ICD-10-CM | POA: Diagnosis not present

## 2019-04-03 DIAGNOSIS — M5416 Radiculopathy, lumbar region: Secondary | ICD-10-CM | POA: Diagnosis not present

## 2019-04-03 DIAGNOSIS — I10 Essential (primary) hypertension: Secondary | ICD-10-CM | POA: Diagnosis not present

## 2019-05-15 DIAGNOSIS — E669 Obesity, unspecified: Secondary | ICD-10-CM | POA: Diagnosis not present

## 2019-05-15 DIAGNOSIS — G4733 Obstructive sleep apnea (adult) (pediatric): Secondary | ICD-10-CM | POA: Diagnosis not present

## 2019-07-15 DIAGNOSIS — I10 Essential (primary) hypertension: Secondary | ICD-10-CM | POA: Diagnosis not present

## 2019-07-15 DIAGNOSIS — R7302 Impaired glucose tolerance (oral): Secondary | ICD-10-CM | POA: Diagnosis not present

## 2019-07-15 DIAGNOSIS — F5221 Male erectile disorder: Secondary | ICD-10-CM | POA: Diagnosis not present

## 2019-07-17 DIAGNOSIS — I10 Essential (primary) hypertension: Secondary | ICD-10-CM | POA: Diagnosis not present

## 2019-07-17 DIAGNOSIS — Z1331 Encounter for screening for depression: Secondary | ICD-10-CM | POA: Diagnosis not present

## 2019-11-22 DIAGNOSIS — E785 Hyperlipidemia, unspecified: Secondary | ICD-10-CM | POA: Diagnosis not present

## 2019-11-26 DIAGNOSIS — E785 Hyperlipidemia, unspecified: Secondary | ICD-10-CM | POA: Diagnosis not present

## 2019-12-18 DIAGNOSIS — M7711 Lateral epicondylitis, right elbow: Secondary | ICD-10-CM | POA: Diagnosis not present

## 2019-12-18 DIAGNOSIS — M7712 Lateral epicondylitis, left elbow: Secondary | ICD-10-CM | POA: Diagnosis not present

## 2020-02-18 ENCOUNTER — Ambulatory Visit: Payer: BC Managed Care – PPO | Admitting: Gastroenterology

## 2020-02-18 ENCOUNTER — Other Ambulatory Visit: Payer: Self-pay

## 2020-02-18 VITALS — BP 142/77 | HR 78 | Temp 98.4°F | Ht 68.0 in | Wt 213.0 lb

## 2020-02-18 DIAGNOSIS — Z860100 Personal history of colon polyps, unspecified: Secondary | ICD-10-CM

## 2020-02-18 DIAGNOSIS — L290 Pruritus ani: Secondary | ICD-10-CM | POA: Diagnosis not present

## 2020-02-18 MED ORDER — PEG 3350-KCL-NABCB-NACL-NASULF 236 G PO SOLR: ORAL | 0 refills | Status: DC

## 2020-02-18 NOTE — Progress Notes (Signed)
[name] MD, MRCP(U.K) [address]  [place]  [place], [place] [address]  Main: [phone]  Fax: [phone]   Gastroenterology Consultation  Referring Provider:     Jodi Marble, MD Primary Care Physician:  Jodi Marble, MD Primary Gastroenterologist:  Dr. [name]  Reason for Consultation:    Rectal pain and colonoscopy        HPI:   Stanley Whitney is a 59 y.o. y/o male    06/16/2016: Colonoscopy: 11 polyps ranging from small 2/10 millimeters in size.  Pathology demonstrated multiple tubular adenomas.  10/04/2016: Colonoscopy: Personal history of colon polyps.  3 sessile polyps were seen in the rectum and ascending colon ranging from 6 to 8 mm in size and resected with a cold snare.  5 mm polyp seen in the cecum taken off with a biopsy forceps.  Tubular adenomas.  He states that he went to have his hernia repaired and then by planning a complication where his bladder was injured and had to have extensive surgery on his abdomen. Presently his main issue is perianal itching discomfort and soiling. He feels that he has to pee multiple times to feel clean in his perianal area. Denies any constipation has regular bowel movements.  Past Medical History:  Diagnosis Date  . Bilateral incarcerated inguinal hernia   . Heartburn   . History of kidney stones   . HLD (hyperlipidemia)   . Hypertension   . Kidney stone     Past Surgical History:  Procedure Laterality Date  . BLADDER REPAIR N/A 04/21/2017   Procedure: REPAIR BLADDER CYSTOTOMY;  Surgeon: Clovis Riley, MD;  Location: [place];  Service: General;  Laterality: N/A;  . COLONOSCOPY WITH PROPOFOL N/A 06/16/2016   Procedure: COLONOSCOPY WITH PROPOFOL;  Surgeon: [name], MD;  Location: [facility] ENDOSCOPY;  Service: Endoscopy;  Laterality: N/A;  . COLONOSCOPY WITH PROPOFOL N/A 10/04/2016   Procedure: COLONOSCOPY WITH PROPOFOL;  Surgeon: [name], MD;  Location: [facility] ENDOSCOPY;  Service: Endoscopy;   Laterality: N/A;  . CYST REMOVAL TRUNK     Back  . EYE SURGERY     lasix  . INGUINAL HERNIA REPAIR Right 04/21/2017   Procedure: OPEN RIGHT INGUINAL HERNIA REPAIR WITH MESH;  Surgeon: Clovis Riley, MD;  Location: [place];  Service: General;  Laterality: Right;  . INSERTION OF MESH Right 04/21/2017   Procedure: INSERTION OF MESH; RIGHT INGUINAL;  Surgeon: Clovis Riley, MD;  Location: [place];  Service: General;  Laterality: Right;  . none      Prior to Admission medications   Medication Sig Start Date End Date Taking? Authorizing Provider  atenolol (TENORMIN) 25 MG tablet Take 25 mg by mouth daily.    [provider]  hydrochlorothiazide (HYDRODIURIL) 25 MG tablet Take 25 mg by mouth daily.    [provider]  Omega-3 Fatty Acids (FISH OIL) 1000 MG CAPS Take 2 capsules by mouth daily.    [provider]  rosuvastatin (CRESTOR) 5 MG tablet Take 5 mg by mouth every other day.     [provider]    Family History  Problem Relation Age of Onset  . Kidney disease Neg Hx   . Prostate cancer Neg Hx      Social History   Tobacco Use  . Smoking status: Former Research scientist (life sciences)  . Smokeless tobacco: Former Systems developer  . Tobacco comment: quit over 30 years  Vaping Use  . Vaping Use: Never used  Substance  Use Topics  . Alcohol use: Never    Comment: social  . Drug use: No    Allergies as of 02/18/2020  . (No Known Allergies)    Review of Systems:    All systems reviewed and negative except where noted in HPI.   Physical Exam:  BP (!) 142/77   Pulse 78   Temp 98.4 F (36.9 C)   Ht 5\' 8"  (1.727 m)   Wt 213 lb (96.6 kg)   BMI 32.39 kg/m  No LMP for male patient. Psych:  Alert and cooperative. Normal mood and affect. General:   Alert,  Well-developed, well-nourished, pleasant and cooperative in NAD Head:  Normocephalic and atraumatic. Eyes:  Sclera clear, no icterus.   Conjunctiva pink. Ears:  Normal auditory acuity. Lungs:  Respirations even and  unlabored.  Clear throughout to auscultation.   No wheezes, crackles, or rhonchi. No acute distress. Heart:  Regular rate and rhythm; no murmurs, clicks, rubs, or gallops. Abdomen:  Normal bowel sounds.  No bruits.  Soft, non-tender and non-distended without masses, hepatosplenomegaly or hernias noted.  No guarding or rebound tenderness.    Neurologic:  Alert and oriented x3;  grossly normal neurologically. Psych:  Alert and cooperative. Normal mood and affect.  Imaging Studies: No results found.  Assessment and Plan:   Stanley Whitney is a 59 y.o. y/o male with a history of over 10 cumulative tubular adenomas on 2 procedures performed in 2018 during colonoscopy. Due for surveillance colonoscopy. Issues with perianal discomfort history very suggestive of hemorrhoidal inflammation. Discussed in depth about conservative management to manage hemorrhoids. This would include a sitz bath, perianal toileting and advised on cleaning after bowel movement. I will examine his perianal area during his colonoscopy.  I have discussed alternative options, risks & benefits,  which include, but are not limited to, bleeding, infection, perforation,respiratory complication & drug reaction.  The patient agrees with this plan & written consent will be obtained.     Follow up in as needed   Dr [name] MD,MRCP(U.K)

## 2020-02-25 ENCOUNTER — Other Ambulatory Visit: Payer: Self-pay

## 2020-02-25 MED ORDER — NA SULFATE-K SULFATE-MG SULF 17.5-3.13-1.6 GM/177ML PO SOLN: 1.0000 | Freq: Once | ORAL | 0 refills | Status: AC

## 2020-03-04 DIAGNOSIS — I10 Essential (primary) hypertension: Secondary | ICD-10-CM | POA: Diagnosis not present

## 2020-03-04 DIAGNOSIS — R7303 Prediabetes: Secondary | ICD-10-CM | POA: Diagnosis not present

## 2020-03-04 DIAGNOSIS — N529 Male erectile dysfunction, unspecified: Secondary | ICD-10-CM | POA: Diagnosis not present

## 2020-03-04 DIAGNOSIS — N486 Induration penis plastica: Secondary | ICD-10-CM | POA: Diagnosis not present

## 2020-03-04 DIAGNOSIS — Z1331 Encounter for screening for depression: Secondary | ICD-10-CM | POA: Diagnosis not present

## 2020-03-04 DIAGNOSIS — E7800 Pure hypercholesterolemia, unspecified: Secondary | ICD-10-CM | POA: Diagnosis not present

## 2020-03-04 DIAGNOSIS — Z125 Encounter for screening for malignant neoplasm of prostate: Secondary | ICD-10-CM | POA: Diagnosis not present

## 2020-03-10 ENCOUNTER — Other Ambulatory Visit
Admission: RE | Admit: 2020-03-10 | Discharge: 2020-03-10 | Disposition: A | Discharge status: Home / Self Care | Payer: BC Managed Care – PPO | Source: Ambulatory Visit | Attending: Gastroenterology | Admitting: Gastroenterology

## 2020-03-10 ENCOUNTER — Other Ambulatory Visit: Payer: Self-pay

## 2020-03-10 DIAGNOSIS — D123 Benign neoplasm of transverse colon: Secondary | ICD-10-CM | POA: Diagnosis not present

## 2020-03-10 DIAGNOSIS — Z09 Encounter for follow-up examination after completed treatment for conditions other than malignant neoplasm: Secondary | ICD-10-CM | POA: Diagnosis not present

## 2020-03-10 DIAGNOSIS — Z01812 Encounter for preprocedural laboratory examination: Secondary | ICD-10-CM | POA: Insufficient documentation

## 2020-03-10 DIAGNOSIS — D122 Benign neoplasm of ascending colon: Secondary | ICD-10-CM | POA: Diagnosis not present

## 2020-03-10 DIAGNOSIS — Z20822 Contact with and (suspected) exposure to covid-19: Secondary | ICD-10-CM | POA: Insufficient documentation

## 2020-03-10 DIAGNOSIS — K640 First degree hemorrhoids: Secondary | ICD-10-CM | POA: Diagnosis not present

## 2020-03-10 DIAGNOSIS — Z79899 Other long term (current) drug therapy: Secondary | ICD-10-CM | POA: Diagnosis not present

## 2020-03-10 DIAGNOSIS — Z86010 Personal history of colonic polyps: Secondary | ICD-10-CM | POA: Diagnosis not present

## 2020-03-10 DIAGNOSIS — D128 Benign neoplasm of rectum: Secondary | ICD-10-CM | POA: Diagnosis not present

## 2020-03-10 DIAGNOSIS — Z87891 Personal history of nicotine dependence: Secondary | ICD-10-CM | POA: Diagnosis not present

## 2020-03-10 LAB — SARS CORONAVIRUS 2 (TAT 6-24 HRS): SARS Coronavirus 2: NEGATIVE

## 2020-03-11 ENCOUNTER — Encounter: Payer: Self-pay | Admitting: Gastroenterology

## 2020-03-12 ENCOUNTER — Encounter: Payer: Self-pay | Admitting: Gastroenterology

## 2020-03-12 ENCOUNTER — Other Ambulatory Visit: Payer: Self-pay

## 2020-03-12 ENCOUNTER — Ambulatory Visit
Admission: RE | Admit: 2020-03-12 | Discharge: 2020-03-12 | Disposition: A | Discharge status: Home / Self Care | Payer: BC Managed Care – PPO | Attending: Gastroenterology | Admitting: Gastroenterology

## 2020-03-12 ENCOUNTER — Ambulatory Visit: Payer: BC Managed Care – PPO | Admitting: Certified Registered Nurse Anesthetist

## 2020-03-12 ENCOUNTER — Encounter
Admission: RE | Disposition: A | Discharge status: Home / Self Care | Payer: Self-pay | Source: Home / Self Care | Attending: Gastroenterology

## 2020-03-12 DIAGNOSIS — Z09 Encounter for follow-up examination after completed treatment for conditions other than malignant neoplasm: Secondary | ICD-10-CM | POA: Insufficient documentation

## 2020-03-12 DIAGNOSIS — Z20822 Contact with and (suspected) exposure to covid-19: Secondary | ICD-10-CM | POA: Diagnosis not present

## 2020-03-12 DIAGNOSIS — K640 First degree hemorrhoids: Secondary | ICD-10-CM | POA: Diagnosis not present

## 2020-03-12 DIAGNOSIS — D123 Benign neoplasm of transverse colon: Secondary | ICD-10-CM | POA: Diagnosis not present

## 2020-03-12 DIAGNOSIS — D128 Benign neoplasm of rectum: Secondary | ICD-10-CM | POA: Diagnosis not present

## 2020-03-12 DIAGNOSIS — D122 Benign neoplasm of ascending colon: Secondary | ICD-10-CM | POA: Insufficient documentation

## 2020-03-12 DIAGNOSIS — Z87891 Personal history of nicotine dependence: Secondary | ICD-10-CM | POA: Insufficient documentation

## 2020-03-12 DIAGNOSIS — Z79899 Other long term (current) drug therapy: Secondary | ICD-10-CM | POA: Diagnosis not present

## 2020-03-12 DIAGNOSIS — Z1211 Encounter for screening for malignant neoplasm of colon: Secondary | ICD-10-CM | POA: Diagnosis not present

## 2020-03-12 DIAGNOSIS — Z86010 Personal history of colonic polyps: Secondary | ICD-10-CM | POA: Diagnosis not present

## 2020-03-12 HISTORY — PX: COLONOSCOPY WITH PROPOFOL: SHX5780

## 2020-03-12 SURGERY — COLONOSCOPY WITH PROPOFOL
Anesthesia: General

## 2020-03-12 MED ORDER — PROPOFOL 500 MG/50ML IV EMUL
INTRAVENOUS | Status: AC
  Filled 2020-03-12: qty 50

## 2020-03-12 MED ORDER — PROPOFOL 500 MG/50ML IV EMUL
INTRAVENOUS | Status: DC | PRN
  Administered 2020-03-12: 150 ug/kg/min via INTRAVENOUS
  Administered 2020-03-12: 50 mg via INTRAVENOUS

## 2020-03-12 MED ORDER — SODIUM CHLORIDE 0.9 % IV SOLN
INTRAVENOUS | Status: DC
  Administered 2020-03-12: 1000 mL via INTRAVENOUS

## 2020-03-12 MED ORDER — LIDOCAINE HCL (CARDIAC) PF 100 MG/5ML IV SOSY
PREFILLED_SYRINGE | INTRAVENOUS | Status: DC | PRN
  Administered 2020-03-12: 50 mg via INTRAVENOUS

## 2020-03-12 MED ORDER — PROPOFOL 10 MG/ML IV BOLUS
INTRAVENOUS | Status: AC
  Filled 2020-03-12: qty 20

## 2020-03-12 NOTE — Anesthesia Preprocedure Evaluation (Signed)
Anesthesia Evaluation  Patient identified by MRN, date of birth, ID band Patient awake    Reviewed: Allergy & Precautions, H&P , NPO status , Patient's Chart, lab work & pertinent test results  History of Anesthesia Complications Negative for: history of anesthetic complications  Airway Mallampati: II  TM Distance: >3 FB     Dental   Pulmonary sleep apnea and Continuous Positive Airway Pressure Ventilation , neg COPD, former smoker,    breath sounds clear to auscultation       Cardiovascular hypertension, (-) angina(-) Past MI and (-) Cardiac Stents (-) dysrhythmias  Rhythm:regular Rate:Normal     Neuro/Psych negative neurological ROS  negative psych ROS   GI/Hepatic negative GI ROS, Neg liver ROS,   Endo/Other  negative endocrine ROS  Renal/GU negative Renal ROS  negative genitourinary   Musculoskeletal   Abdominal   Peds  Hematology negative hematology ROS (+)   Anesthesia Other Findings Past Medical History: No date: Bilateral incarcerated inguinal hernia No date: Heartburn No date: History of kidney stones No date: HLD (hyperlipidemia) No date: Hypertension No date: Kidney stone  Past Surgical History: 04/21/2017: BLADDER REPAIR; N/A     Comment:  Procedure: REPAIR BLADDER CYSTOTOMY;  Surgeon: Clovis Riley, MD;  Location: [place];  Service: General;                Laterality: N/A; 06/16/2016: COLONOSCOPY WITH PROPOFOL; N/A     Comment:  Procedure: COLONOSCOPY WITH PROPOFOL;  Surgeon: [name], MD;  Location: [facility] ENDOSCOPY;  Service: Endoscopy;              Laterality: N/A; 10/04/2016: COLONOSCOPY WITH PROPOFOL; N/A     Comment:  Procedure: COLONOSCOPY WITH PROPOFOL;  Surgeon: [name], MD;  Location: [facility] ENDOSCOPY;  Service:               Endoscopy;  Laterality: N/A; No date: CYST REMOVAL TRUNK     Comment:  Back No date: EYE SURGERY     Comment:   lasix 04/21/2017: INGUINAL HERNIA REPAIR; Right     Comment:  Procedure: OPEN RIGHT INGUINAL HERNIA REPAIR WITH MESH;               Surgeon: Clovis Riley, MD;  Location: [place];                Service: General;  Laterality: Right; 04/21/2017: INSERTION OF MESH; Right     Comment:  Procedure: INSERTION OF MESH; RIGHT INGUINAL;  Surgeon:               Clovis Riley, MD;  Location: [place];  Service:               General;  Laterality: Right; No date: none  BMI    Body Mass Index: 30.47 kg/m      Reproductive/Obstetrics negative OB ROS                             Anesthesia Physical Anesthesia Plan  ASA: II  Anesthesia Plan: General   Post-op Pain Management:    Induction:   PONV Risk Score and Plan: Propofol infusion and TIVA  Airway Management Planned: Simple Face Mask  Additional Equipment:   Intra-op Plan:   Post-operative Plan:   Informed Consent: I have reviewed the patients History and Physical, chart, labs and discussed the procedure including the risks, benefits and alternatives for the proposed anesthesia with the patient or authorized representative who has indicated his/her understanding and acceptance.     Dental Advisory Given  Plan Discussed with: Anesthesiologist, CRNA and Surgeon  Anesthesia Plan Comments:         Anesthesia Quick Evaluation

## 2020-03-12 NOTE — Op Note (Signed)
[facility] Gastroenterology Patient Name: Stanley Whitney Procedure Date: 03/12/2020 8:13 AM MRN: [id number] Account #: [id number] Date of Birth: 07/08/1960 Admit Type: Outpatient Age: 59 Room: [facility] ENDO ROOM 3 Gender: Male Note Status: Finalized Procedure:             Colonoscopy Indications:           Surveillance: Personal history of adenomatous polyps                         on last colonoscopy 3 years ago, Last colonoscopy:                         July 2018 Providers:             [name] MD, MD Referring MD:          [name] (Referring MD) Medicines:             Monitored Anesthesia Care Complications:         No immediate complications. Procedure:             Pre-Anesthesia Assessment:                        - Prior to the procedure, a History and Physical was                         performed, and patient medications, allergies and                         sensitivities were reviewed. The patient's tolerance                         of previous anesthesia was reviewed.                        - The risks and benefits of the procedure and the                         sedation options and risks were discussed with the                         patient. All questions were answered and informed                         consent was obtained.                        - ASA Grade Assessment: II - A patient with mild                         systemic disease.                        After obtaining informed consent, the colonoscope was                         passed under direct vision. Throughout the procedure,                         the patient's blood pressure, pulse, and oxygen  saturations were monitored continuously. The                         Colonoscope was introduced through the anus and                         advanced to the the cecum, identified by the                         appendiceal orifice. The colonoscopy was performed                          with ease. The patient tolerated the procedure well.                         The quality of the bowel preparation was poor. Findings:      A large amount of semi-liquid stool was found in the proximal transverse       colon, at the hepatic flexure, in the ascending colon and in the cecum,       interfering with visualization. Impression:            - Preparation of the colon was poor.                        - Stool in the proximal transverse colon, at the                         hepatic flexure, in the ascending colon and in the                         cecum.                        - No specimens collected. Recommendation:        - Discharge patient to home (with escort).                        - Resume previous diet.                        - Continue present medications.                        - Repeat colonoscopy tomorrow because the bowel                         preparation was suboptimal. Procedure Code(s):     --- Professional ---                        [phone], Colonoscopy, flexible; diagnostic, including                         collection of specimen(s) by brushing or washing, when                         performed (separate procedure) Diagnosis Code(s):     --- Professional ---                        Z86.010,  Personal history of colonic polyps CPT copyright 2019 American Medical Association. All rights reserved. The codes documented in this report are preliminary and upon coder review may  be revised to meet current compliance requirements. [name], MD [name] MD, MD 03/12/2020 8:26:14 AM This report has been signed electronically. Number of Addenda: 0 Note Initiated On: 03/12/2020 8:13 AM Scope Withdrawal Time: 0 hours 2 minutes 48 seconds  Total Procedure Duration: 0 hours 4 minutes 46 seconds  Estimated Blood Loss:  Estimated blood loss: none.      [facility]

## 2020-03-12 NOTE — H&P (Signed)
[name], MD [address], [place], [place], [place], [address] [address], [place], [place], [place], [address] Phone: [phone]  Fax: [phone]  Primary Care Physician:  [name], PA-C   Pre-Procedure History & Physical: HPI:  Stanley Whitney is a 59 y.o. male is here for an colonoscopy.   Past Medical History:  Diagnosis Date  . Bilateral incarcerated inguinal hernia   . Heartburn   . History of kidney stones   . HLD (hyperlipidemia)   . Hypertension   . Kidney stone     Past Surgical History:  Procedure Laterality Date  . BLADDER REPAIR N/A 04/21/2017   Procedure: REPAIR BLADDER CYSTOTOMY;  Surgeon: Clovis Riley, MD;  Location: [place];  Service: General;  Laterality: N/A;  . COLONOSCOPY WITH PROPOFOL N/A 06/16/2016   Procedure: COLONOSCOPY WITH PROPOFOL;  Surgeon: [name], MD;  Location: [facility] ENDOSCOPY;  Service: Endoscopy;  Laterality: N/A;  . COLONOSCOPY WITH PROPOFOL N/A 10/04/2016   Procedure: COLONOSCOPY WITH PROPOFOL;  Surgeon: [name], MD;  Location: [facility] ENDOSCOPY;  Service: Endoscopy;  Laterality: N/A;  . CYST REMOVAL TRUNK     Back  . EYE SURGERY     lasix  . INGUINAL HERNIA REPAIR Right 04/21/2017   Procedure: OPEN RIGHT INGUINAL HERNIA REPAIR WITH MESH;  Surgeon: Clovis Riley, MD;  Location: [place];  Service: General;  Laterality: Right;  . INSERTION OF MESH Right 04/21/2017   Procedure: INSERTION OF MESH; RIGHT INGUINAL;  Surgeon: Clovis Riley, MD;  Location: [place];  Service: General;  Laterality: Right;  . none      Prior to Admission medications   Medication Sig Start Date End Date Taking? Authorizing Provider  atenolol (TENORMIN) 25 MG tablet Take 25 mg by mouth daily.   Yes [provider]  hydrochlorothiazide (HYDRODIURIL) 25 MG tablet Take 25 mg by mouth daily.   Yes [provider]  Omega-3 Fatty Acids (FISH OIL) 1000 MG CAPS Take 2 capsules by mouth daily.   Yes [provider]   polyethylene glycol (GOLYTELY) 236 g solution Drink 8 oz every 20-30 minutes until entire prep is finished 02/18/20  Yes [name], MD  rosuvastatin (CRESTOR) 5 MG tablet Take 5 mg by mouth every other day.    Yes [provider]    Allergies as of 02/19/2020  . (No Known Allergies)    Family History  Problem Relation Age of Onset  . Kidney disease Neg Hx   . Prostate cancer Neg Hx     Social History   Socioeconomic History  . Marital status: Married    Spouse name: Not on file  . Number of children: Not on file  . Years of education: Not on file  . Highest education level: Not on file  Occupational History  . Not on file  Tobacco Use  . Smoking status: Former Research scientist (life sciences)  . Smokeless tobacco: Former Systems developer  . Tobacco comment: quit over 30 years  Vaping Use  . Vaping Use: Never used  Substance and Sexual Activity  . Alcohol use: Never    Comment: social  . Drug use: No  . Sexual activity: Not on file  Other Topics Concern  . Not on file  Social History Narrative  . Not on file   Social Determinants of Health   Financial Resource Strain: Not on file  Food Insecurity: Not on file  Transportation Needs: Not on file  Physical Activity: Not on file  Stress: Not  on file  Social Connections: Not on file  Intimate Partner Violence: Not on file    Review of Systems: See HPI, otherwise negative ROS  Physical Exam: BP (!) 151/101   Pulse (!) 58   Temp (!) 97.3 F (36.3 C) (Temporal)   Resp 18   Ht 5\' 8"  (1.727 m)   Wt 90.9 kg   SpO2 100%   BMI 30.47 kg/m  General:   Alert,  pleasant and cooperative in NAD Head:  Normocephalic and atraumatic. Neck:  Supple; no masses or thyromegaly. Lungs:  Clear throughout to auscultation, normal respiratory effort.    Heart:  +S1, +S2, Regular rate and rhythm, No edema. Abdomen:  Soft, nontender and nondistended. Normal bowel sounds, without guarding, and without rebound.   Neurologic:  Alert and  oriented x4;   grossly normal neurologically.  Impression/Plan: Stanley Whitney is here for an colonoscopy to be performed for surveillance due to prior history of colon polyps   Risks, benefits, limitations, and alternatives regarding  colonoscopy have been reviewed with the patient.  Questions have been answered.  All parties agreeable.   [name], MD  03/12/2020, 8:16 AM

## 2020-03-12 NOTE — Transfer of Care (Signed)
Immediate Anesthesia Transfer of Care Note  Patient: Stanley Whitney  Procedure(s) Performed: COLONOSCOPY WITH PROPOFOL (N/A )  Patient Location: PACU  Anesthesia Type:General  Level of Consciousness: awake, alert  and oriented  Airway & Oxygen Therapy: Patient Spontanous Breathing and Patient connected to nasal cannula oxygen  Post-op Assessment: Report given to RN and Post -op Vital signs reviewed and stable  Post vital signs: Reviewed and stable  Last Vitals:  Vitals Value Taken Time  BP 118/78 03/12/20 0827  Temp    Pulse 57 03/12/20 0828  Resp 10 03/12/20 0828  SpO2 95 % 03/12/20 0828  Vitals shown include unvalidated device data.  Last Pain:  Vitals:   03/12/20 0720  TempSrc: Temporal  PainSc: 0-No pain         Complications: No complications documented.

## 2020-03-13 ENCOUNTER — Encounter
Admission: RE | Disposition: A | Discharge status: Home / Self Care | Payer: Self-pay | Source: Home / Self Care | Attending: Gastroenterology

## 2020-03-13 ENCOUNTER — Encounter: Payer: Self-pay | Admitting: Gastroenterology

## 2020-03-13 ENCOUNTER — Ambulatory Visit: Payer: BC Managed Care – PPO | Admitting: Certified Registered"

## 2020-03-13 ENCOUNTER — Other Ambulatory Visit: Payer: Self-pay

## 2020-03-13 ENCOUNTER — Ambulatory Visit (HOSPITAL_BASED_OUTPATIENT_CLINIC_OR_DEPARTMENT_OTHER)
Admission: RE | Admit: 2020-03-13 | Discharge: 2020-03-13 | Disposition: A | Discharge status: Home / Self Care | Payer: BC Managed Care – PPO | Source: Home / Self Care | Attending: Gastroenterology | Admitting: Gastroenterology

## 2020-03-13 DIAGNOSIS — K635 Polyp of colon: Secondary | ICD-10-CM

## 2020-03-13 DIAGNOSIS — Z20822 Contact with and (suspected) exposure to covid-19: Secondary | ICD-10-CM | POA: Diagnosis not present

## 2020-03-13 DIAGNOSIS — D122 Benign neoplasm of ascending colon: Secondary | ICD-10-CM | POA: Insufficient documentation

## 2020-03-13 DIAGNOSIS — D123 Benign neoplasm of transverse colon: Secondary | ICD-10-CM | POA: Insufficient documentation

## 2020-03-13 DIAGNOSIS — D128 Benign neoplasm of rectum: Secondary | ICD-10-CM | POA: Insufficient documentation

## 2020-03-13 DIAGNOSIS — Z86010 Personal history of colonic polyps: Secondary | ICD-10-CM | POA: Diagnosis not present

## 2020-03-13 DIAGNOSIS — Z09 Encounter for follow-up examination after completed treatment for conditions other than malignant neoplasm: Secondary | ICD-10-CM | POA: Diagnosis not present

## 2020-03-13 DIAGNOSIS — Z1211 Encounter for screening for malignant neoplasm of colon: Secondary | ICD-10-CM | POA: Diagnosis not present

## 2020-03-13 DIAGNOSIS — Z79899 Other long term (current) drug therapy: Secondary | ICD-10-CM | POA: Insufficient documentation

## 2020-03-13 DIAGNOSIS — K640 First degree hemorrhoids: Secondary | ICD-10-CM | POA: Insufficient documentation

## 2020-03-13 DIAGNOSIS — Z87891 Personal history of nicotine dependence: Secondary | ICD-10-CM | POA: Insufficient documentation

## 2020-03-13 DIAGNOSIS — K649 Unspecified hemorrhoids: Secondary | ICD-10-CM | POA: Diagnosis not present

## 2020-03-13 HISTORY — PX: COLONOSCOPY WITH PROPOFOL: SHX5780

## 2020-03-13 SURGERY — COLONOSCOPY WITH PROPOFOL
Anesthesia: General

## 2020-03-13 MED ORDER — PROPOFOL 10 MG/ML IV BOLUS
INTRAVENOUS | Status: DC | PRN
  Administered 2020-03-13: 30 mg via INTRAVENOUS
  Administered 2020-03-13: 70 mg via INTRAVENOUS

## 2020-03-13 MED ORDER — MIDAZOLAM HCL 2 MG/2ML IJ SOLN
INTRAMUSCULAR | Status: AC
  Filled 2020-03-13: qty 2

## 2020-03-13 MED ORDER — PROPOFOL 500 MG/50ML IV EMUL
INTRAVENOUS | Status: DC | PRN
  Administered 2020-03-13: 120 ug/kg/min via INTRAVENOUS

## 2020-03-13 MED ORDER — MIDAZOLAM HCL 5 MG/5ML IJ SOLN
INTRAMUSCULAR | Status: DC | PRN
  Administered 2020-03-13: 2 mg via INTRAVENOUS

## 2020-03-13 MED ORDER — SODIUM CHLORIDE 0.9 % IV SOLN: INTRAVENOUS | Status: DC

## 2020-03-13 MED ORDER — LIDOCAINE 2% (20 MG/ML) 5 ML SYRINGE
INTRAMUSCULAR | Status: DC | PRN
  Administered 2020-03-13: 25 mg via INTRAVENOUS

## 2020-03-13 NOTE — Anesthesia Postprocedure Evaluation (Signed)
Anesthesia Post Note  Patient: Stanley Whitney  Procedure(s) Performed: COLONOSCOPY WITH PROPOFOL (N/A )  Patient location during evaluation: PACU Anesthesia Type: General Level of consciousness: awake and alert Pain management: pain level controlled Vital Signs Assessment: post-procedure vital signs reviewed and stable Respiratory status: spontaneous breathing, nonlabored ventilation and respiratory function stable Cardiovascular status: blood pressure returned to baseline and stable Postop Assessment: no apparent nausea or vomiting Anesthetic complications: no   No complications documented.   Last Vitals:  Vitals:   03/12/20 0848 03/12/20 0850  BP: (!) 146/88   Pulse: (!) 50 (!) 50  Resp: 12 20  Temp:    SpO2: 99% 100%    Last Pain:  Vitals:   03/13/20 0730  TempSrc:   PainSc: 0-No pain                 Brett Canales Halee Glynn

## 2020-03-13 NOTE — Anesthesia Postprocedure Evaluation (Signed)
Anesthesia Post Note  Patient: Stanley Whitney  Procedure(s) Performed: COLONOSCOPY WITH PROPOFOL (N/A )  Patient location during evaluation: Endoscopy Anesthesia Type: General Level of consciousness: awake and alert Pain management: pain level controlled Vital Signs Assessment: post-procedure vital signs reviewed and stable Respiratory status: spontaneous breathing, nonlabored ventilation, respiratory function stable and patient connected to nasal cannula oxygen Cardiovascular status: blood pressure returned to baseline and stable Postop Assessment: no apparent nausea or vomiting Anesthetic complications: no   No complications documented.   Last Vitals:  Vitals:   03/13/20 1204 03/13/20 1214  BP: 136/82 (!) 146/92  Pulse: 73 (!) 56  Resp: 16 16  Temp:    SpO2: 99% 99%    Last Pain:  Vitals:   03/13/20 1214  TempSrc:   PainSc: 0-No pain                 Martha Clan

## 2020-03-13 NOTE — Op Note (Signed)
[facility] Gastroenterology Patient Name: Stanley Whitney Procedure Date: 03/13/2020 11:19 AM MRN: [id number] Account #: [id number] Date of Birth: 06-11-1960 Admit Type: Outpatient Age: 59 Room: [facility] ENDO ROOM 4 Gender: Male Note Status: Finalized Procedure:             Colonoscopy Indications:           Surveillance: Personal history of colonic polyps                         (unknown histology) on last colonoscopy 3 years ago,                         Last colonoscopy: December 2021 Providers:             [name] MD, MD Medicines:             Propofol per Anesthesia, Monitored Anesthesia Care Complications:         No immediate complications. Procedure:             Pre-Anesthesia Assessment:                        - Prior to the procedure, a History and Physical was                         performed, and patient medications, allergies and                         sensitivities were reviewed. The patient's tolerance                         of previous anesthesia was reviewed.                        - The risks and benefits of the procedure and the                         sedation options and risks were discussed with the                         patient. All questions were answered and informed                         consent was obtained.                        - ASA Grade Assessment: II - A patient with mild                         systemic disease.                        After obtaining informed consent, the colonoscope was                         passed under direct vision. Throughout the procedure,                         the patient's blood pressure, pulse, and oxygen  saturations were monitored continuously. The                         Colonoscope was introduced through the anus and                         advanced to the the cecum, identified by the                         appendiceal orifice. The colonoscopy was performed                          with ease. The patient tolerated the procedure well.                         The quality of the bowel preparation was excellent. Findings:      The perianal and digital rectal examinations were normal.      Non-bleeding internal hemorrhoids were found during retroflexion. The       hemorrhoids were large and Grade I (internal hemorrhoids that do not       prolapse).      Five sessile polyps were found in the ascending colon. The polyps were 4       to 6 mm in size. These polyps were removed with a cold snare. Resection       and retrieval were complete.      Two sessile polyps were found in the rectum and transverse colon. The       polyps were 4 to 5 mm in size. These polyps were removed with a cold       snare. Resection and retrieval were complete.      The exam was otherwise without abnormality on direct and retroflexion       views. Impression:            - Non-bleeding internal hemorrhoids.                        - Five 4 to 6 mm polyps in the ascending colon,                         removed with a cold snare. Resected and retrieved.                        - Two 4 to 5 mm polyps in the rectum and in the                         transverse colon, removed with a cold snare. Resected                         and retrieved.                        - The examination was otherwise normal on direct and                         retroflexion views. Recommendation:        - Discharge patient to home (with escort).                        -  Resume previous diet.                        - Continue present medications.                        - Await pathology results.                        - Repeat colonoscopy in 3 years for surveillance. Procedure Code(s):     --- Professional ---                        [phone], Colonoscopy, flexible; with removal of                         tumor(s), polyp(s), or other lesion(s) by snare                         technique Diagnosis Code(s):     ---  Professional ---                        Z86.010, Personal history of colonic polyps                        K62.1, Rectal polyp                        K63.5, Polyp of colon                        K64.0, First degree hemorrhoids CPT copyright 2019 American Medical Association. All rights reserved. The codes documented in this report are preliminary and upon coder review may  be revised to meet current compliance requirements. [name], MD [name] MD, MD 03/13/2020 11:53:11 AM This report has been signed electronically. Number of Addenda: 0 Note Initiated On: 03/13/2020 11:19 AM Scope Withdrawal Time: 0 hours 18 minutes 39 seconds  Total Procedure Duration: 0 hours 21 minutes 10 seconds  Estimated Blood Loss:  Estimated blood loss: none.      [facility]

## 2020-03-13 NOTE — H&P (Signed)
[name], MD [address] [place], [place], [place], [address] [address], [place], [place], [place], [address] Phone: [phone]  Fax: [phone]  Primary Care Physician:  [name], PA-C   Pre-Procedure History & Physical: HPI:  Stanley Whitney is a 59 y.o. male is here for an colonoscopy.   Past Medical History:  Diagnosis Date  . Bilateral incarcerated inguinal hernia   . Heartburn   . History of kidney stones   . HLD (hyperlipidemia)   . Hypertension   . Kidney stone     Past Surgical History:  Procedure Laterality Date  . BLADDER REPAIR N/A 04/21/2017   Procedure: REPAIR BLADDER CYSTOTOMY;  Surgeon: Clovis Riley, MD;  Location: [place];  Service: General;  Laterality: N/A;  . COLONOSCOPY WITH PROPOFOL N/A 06/16/2016   Procedure: COLONOSCOPY WITH PROPOFOL;  Surgeon: [name], MD;  Location: [facility] ENDOSCOPY;  Service: Endoscopy;  Laterality: N/A;  . COLONOSCOPY WITH PROPOFOL N/A 10/04/2016   Procedure: COLONOSCOPY WITH PROPOFOL;  Surgeon: [name], MD;  Location: [facility] ENDOSCOPY;  Service: Endoscopy;  Laterality: N/A;  . COLONOSCOPY WITH PROPOFOL N/A 03/12/2020   Procedure: COLONOSCOPY WITH PROPOFOL;  Surgeon: [name], MD;  Location: [facility] ENDOSCOPY;  Service: Gastroenterology;  Laterality: N/A;  . CYST REMOVAL TRUNK     Back  . EYE SURGERY     lasix  . INGUINAL HERNIA REPAIR Right 04/21/2017   Procedure: OPEN RIGHT INGUINAL HERNIA REPAIR WITH MESH;  Surgeon: Clovis Riley, MD;  Location: [place];  Service: General;  Laterality: Right;  . INSERTION OF MESH Right 04/21/2017   Procedure: INSERTION OF MESH; RIGHT INGUINAL;  Surgeon: Clovis Riley, MD;  Location: [place];  Service: General;  Laterality: Right;  . none      Prior to Admission medications   Medication Sig Start Date End Date Taking? Authorizing Provider  atenolol (TENORMIN) 25 MG tablet Take 25 mg by mouth daily.   Yes [provider]  hydrochlorothiazide (HYDRODIURIL) 25 MG  tablet Take 25 mg by mouth daily.   Yes [provider]  Omega-3 Fatty Acids (FISH OIL) 1000 MG CAPS Take 2 capsules by mouth daily.    [provider]  polyethylene glycol (GOLYTELY) 236 g solution Drink 8 oz every 20-30 minutes until entire prep is finished 02/18/20   [name], MD  rosuvastatin (CRESTOR) 5 MG tablet Take 5 mg by mouth every other day.     [provider]    Allergies as of 03/12/2020  . (No Known Allergies)    Family History  Problem Relation Age of Onset  . Kidney disease Neg Hx   . Prostate cancer Neg Hx     Social History   Socioeconomic History  . Marital status: Married    Spouse name: Not on file  . Number of children: Not on file  . Years of education: Not on file  . Highest education level: Not on file  Occupational History  . Not on file  Tobacco Use  . Smoking status: Former Research scientist (life sciences)  . Smokeless tobacco: Former Systems developer  . Tobacco comment: quit over 30 years  Vaping Use  . Vaping Use: Never used  Substance and Sexual Activity  . Alcohol use: Never    Comment: social  . Drug use: No  . Sexual activity: Not on file  Other Topics Concern  . Not on file  Social History Narrative  . Not on file   Social Determinants of Health  Financial Resource Strain: Not on file  Food Insecurity: Not on file  Transportation Needs: Not on file  Physical Activity: Not on file  Stress: Not on file  Social Connections: Not on file  Intimate Partner Violence: Not on file    Review of Systems: See HPI, otherwise negative ROS  Physical Exam: BP (!) 161/95   Pulse 76   Temp 97.7 F (36.5 C) (Temporal)   Resp 16   Ht 5\' 8"  (1.727 m)   Wt 88.5 kg   SpO2 99%   BMI 29.65 kg/m  General:   Alert,  pleasant and cooperative in NAD Head:  Normocephalic and atraumatic. Neck:  Supple; no masses or thyromegaly. Lungs:  Clear throughout to auscultation, normal respiratory effort.    Heart:  +S1, +S2, Regular rate and rhythm, No  edema. Abdomen:  Soft, nontender and nondistended. Normal bowel sounds, without guarding, and without rebound.   Neurologic:  Alert and  oriented x4;  grossly normal neurologically.  Impression/Plan: [name] is here for an colonoscopy to be performed for surveillance due to prior history of colon polyps   Risks, benefits, limitations, and alternatives regarding  colonoscopy have been reviewed with the patient.  Questions have been answered.  All parties agreeable.   [name], MD  03/13/2020, 11:18 AM

## 2020-03-13 NOTE — Anesthesia Preprocedure Evaluation (Signed)
Anesthesia Evaluation  Patient identified by MRN, date of birth, ID band Patient awake    Reviewed: Allergy & Precautions, H&P , NPO status , Patient's Chart, lab work & pertinent test results  History of Anesthesia Complications Negative for: history of anesthetic complications  Airway Mallampati: II  TM Distance: >3 FB     Dental  (+) Dental Advidsory Given   Pulmonary neg shortness of breath, sleep apnea and Continuous Positive Airway Pressure Ventilation , neg COPD, neg recent URI, former smoker,    breath sounds clear to auscultation       Cardiovascular hypertension, (-) angina(-) Past MI and (-) Cardiac Stents (-) dysrhythmias  Rhythm:regular Rate:Normal     Neuro/Psych negative neurological ROS  negative psych ROS   GI/Hepatic negative GI ROS, Neg liver ROS,   Endo/Other  negative endocrine ROS  Renal/GU negative Renal ROS  negative genitourinary   Musculoskeletal   Abdominal   Peds  Hematology negative hematology ROS (+)   Anesthesia Other Findings Past Medical History: No date: Bilateral incarcerated inguinal hernia No date: Heartburn No date: History of kidney stones No date: HLD (hyperlipidemia) No date: Hypertension No date: Kidney stone  Past Surgical History: 04/21/2017: BLADDER REPAIR; N/A     Comment:  Procedure: REPAIR BLADDER CYSTOTOMY;  Surgeon: Clovis Riley, MD;  Location: [place];  Service: General;                Laterality: N/A; 06/16/2016: COLONOSCOPY WITH PROPOFOL; N/A     Comment:  Procedure: COLONOSCOPY WITH PROPOFOL;  Surgeon: [name], MD;  Location: [facility] ENDOSCOPY;  Service: Endoscopy;              Laterality: N/A; 10/04/2016: COLONOSCOPY WITH PROPOFOL; N/A     Comment:  Procedure: COLONOSCOPY WITH PROPOFOL;  Surgeon: [name], MD;  Location: [facility] ENDOSCOPY;  Service:               Endoscopy;  Laterality: N/A; No date: CYST  REMOVAL TRUNK     Comment:  Back No date: EYE SURGERY     Comment:  lasix 04/21/2017: INGUINAL HERNIA REPAIR; Right     Comment:  Procedure: OPEN RIGHT INGUINAL HERNIA REPAIR WITH MESH;               Surgeon: Clovis Riley, MD;  Location: [place];                Service: General;  Laterality: Right; 04/21/2017: INSERTION OF MESH; Right     Comment:  Procedure: INSERTION OF MESH; RIGHT INGUINAL;  Surgeon:               Clovis Riley, MD;  Location: [place];  Service:               General;  Laterality: Right; No date: none  BMI    Body Mass Index: 30.47 kg/m      Reproductive/Obstetrics negative OB ROS                             Anesthesia Physical  Anesthesia Plan  ASA: II  Anesthesia Plan: General   Post-op Pain Management:    Induction:   PONV Risk Score  and Plan: Propofol infusion and TIVA  Airway Management Planned: Natural Airway and Nasal Cannula  Additional Equipment:   Intra-op Plan:   Post-operative Plan:   Informed Consent: I have reviewed the patients History and Physical, chart, labs and discussed the procedure including the risks, benefits and alternatives for the proposed anesthesia with the patient or authorized representative who has indicated his/her understanding and acceptance.     Dental Advisory Given  Plan Discussed with: Anesthesiologist, CRNA and Surgeon  Anesthesia Plan Comments:         Anesthesia Quick Evaluation

## 2020-03-13 NOTE — Transfer of Care (Signed)
Immediate Anesthesia Transfer of Care Note  Patient: Stanley Whitney  Procedure(s) Performed: COLONOSCOPY WITH PROPOFOL (N/A )  Patient Location: Endoscopy Unit  Anesthesia Type:General  Level of Consciousness: sedated  Airway & Oxygen Therapy: Patient Spontanous Breathing  Post-op Assessment: Report given to RN and Post -op Vital signs reviewed and stable  Post vital signs: Reviewed  Last Vitals:  Vitals Value Taken Time  BP 121/68 03/13/20 1154  Temp 36.9 C 03/13/20 1154  Pulse 69 03/13/20 1154  Resp 12 03/13/20 1154  SpO2 98 % 03/13/20 1154  Vitals shown include unvalidated device data.  Last Pain:  Vitals:   03/13/20 1020  TempSrc: Temporal  PainSc: 0-No pain         Complications: No complications documented.

## 2020-03-16 ENCOUNTER — Encounter: Payer: Self-pay | Admitting: Gastroenterology

## 2020-03-16 LAB — SURGICAL PATHOLOGY

## 2020-03-18 ENCOUNTER — Other Ambulatory Visit: Payer: Self-pay

## 2020-03-18 ENCOUNTER — Ambulatory Visit: Payer: BC Managed Care – PPO | Admitting: Urology

## 2020-03-18 ENCOUNTER — Encounter: Payer: Self-pay | Admitting: Urology

## 2020-03-18 VITALS — BP 161/85 | HR 64 | Ht 68.0 in | Wt 200.0 lb

## 2020-03-18 DIAGNOSIS — N486 Induration penis plastica: Secondary | ICD-10-CM

## 2020-03-18 MED ORDER — TADALAFIL 5 MG PO TABS: 5.0000 mg | ORAL_TABLET | Freq: Every day | ORAL | 0 refills | Status: AC

## 2020-03-18 NOTE — Progress Notes (Addendum)
03/18/2020 9:14 AM   Stanley Whitney 1960-07-06 [id number]  Referring provider: [name], PA-C [address] [place],  [place] [address]  Chief Complaint  Patient presents with  . Abnormal Penile Curvature    HPI: Stanley Whitney is a 59 y.o. male who presents for evaluation of Peyronie's disease.   Onset dorsal penile curvature proximal-mid shaft 2019  90 degree curvature and states penis comes in contact with abdomen during erection  Unable to have successful intercourse secondary to degree of curvature  No pain with erections  No hourglass deformity  Saw Dr. Yves Dill approximately 2 years ago and states he was offered Xiaflex or surgery but that insurance would not cover  Has been prescribed tadalafil 5 mg daily  No prior history of penile fracture  Complains of penile retraction  Was seen here 2017 for dipstick positive hematuria  Had intraoperative bladder laceration during hernia repair in [facility] in 2019 requiring intraoperative urology consultation   PMH: Past Medical History:  Diagnosis Date  . Bilateral incarcerated inguinal hernia   . Heartburn   . History of kidney stones   . HLD (hyperlipidemia)   . Hypertension   . Kidney stone     Surgical History: Past Surgical History:  Procedure Laterality Date  . BLADDER REPAIR N/A 04/21/2017   Procedure: REPAIR BLADDER CYSTOTOMY;  Surgeon: Clovis Riley, MD;  Location: [place];  Service: General;  Laterality: N/A;  . COLONOSCOPY WITH PROPOFOL N/A 06/16/2016   Procedure: COLONOSCOPY WITH PROPOFOL;  Surgeon: [name], MD;  Location: [facility] ENDOSCOPY;  Service: Endoscopy;  Laterality: N/A;  . COLONOSCOPY WITH PROPOFOL N/A 10/04/2016   Procedure: COLONOSCOPY WITH PROPOFOL;  Surgeon: [name], MD;  Location: [facility] ENDOSCOPY;  Service: Endoscopy;  Laterality: N/A;  . COLONOSCOPY WITH PROPOFOL N/A 03/12/2020   Procedure: COLONOSCOPY WITH PROPOFOL;  Surgeon: [name], MD;  Location: [facility] ENDOSCOPY;   Service: Gastroenterology;  Laterality: N/A;  . COLONOSCOPY WITH PROPOFOL N/A 03/13/2020   Procedure: COLONOSCOPY WITH PROPOFOL;  Surgeon: [name], MD;  Location: [facility] ENDOSCOPY;  Service: Gastroenterology;  Laterality: N/A;  . CYST REMOVAL TRUNK     Back  . EYE SURGERY     lasix  . INGUINAL HERNIA REPAIR Right 04/21/2017   Procedure: OPEN RIGHT INGUINAL HERNIA REPAIR WITH MESH;  Surgeon: Clovis Riley, MD;  Location: [place];  Service: General;  Laterality: Right;  . INSERTION OF MESH Right 04/21/2017   Procedure: INSERTION OF MESH; RIGHT INGUINAL;  Surgeon: Clovis Riley, MD;  Location: [place];  Service: General;  Laterality: Right;  . none      Home Medications:  Allergies as of 03/18/2020   No Known Allergies     Medication List       Accurate as of March 18, 2020  9:14 AM. If you have any questions, ask your nurse or doctor.        atenolol 25 MG tablet Commonly known as: TENORMIN Take 25 mg by mouth daily.   cetirizine 10 MG tablet Commonly known as: ZYRTEC cetirizine 10 mg tablet  TAKE 1 TABLET BY MOUTH ONCE DAILY   Fish Oil 1000 MG Caps Take 2 capsules by mouth daily.   hydrochlorothiazide 25 MG tablet Commonly known as: HYDRODIURIL Take 25 mg by mouth daily.   polyethylene glycol 236 g solution Commonly known as: GoLYTELY Drink 8 oz every 20-30 minutes until entire prep is finished   rosuvastatin 5 MG tablet Commonly known as: CRESTOR Take 5 mg  by mouth every other day.       Allergies: No Known Allergies  Family History: Family History  Problem Relation Age of Onset  . Kidney disease Neg Hx   . Prostate cancer Neg Hx     Social History:  reports that he has quit smoking. He has quit using smokeless tobacco. He reports that he does not drink alcohol and does not use drugs.   Physical Exam: BP (!) 161/85   Pulse 64   Ht 5\' 8"  (1.727 m)   Wt 200 lb (90.7 kg)   BMI 30.41 kg/m   Constitutional:  Alert and oriented, No acute  distress. HEENT: [place] AT, moist mucus membranes.  Trachea midline, no masses. Cardiovascular: No clubbing, cyanosis, or edema. Respiratory: Normal respiratory effort, no increased work of breathing. GI: Abdomen is soft, nontender, nondistended, no abdominal masses GU: Phallus circumcised; extensive dorsal plaque proximal to mid shaft; stretched penile length normal.  Testes descended bilaterally without masses or tenderness Skin: No rashes, bruises or suspicious lesions. Neurologic: Grossly intact, no focal deficits, moving all 4 extremities. Psychiatric: Normal mood and affect.   Assessment & Plan:    1.  Peyronie's disease  Significant dorsal curvature which prohibits intercourse  We discussed Xiaflex however this may not improve curvature to his satisfaction  Recommended referral to Dr. Francesca Jewett at [facility] to discuss surgical options versus Xiaflex  Requested refill of tadalafil   [name], [place] [address] [place],  [address] [phone]

## 2020-03-18 NOTE — Addendum Note (Signed)
Addended by: [name] on: 03/18/2020 09:52 AM   Modules accepted: Orders

## 2020-03-19 ENCOUNTER — Encounter: Payer: Self-pay | Admitting: Gastroenterology

## 2020-03-20 ENCOUNTER — Telehealth: Payer: Self-pay

## 2020-03-20 DIAGNOSIS — Z86010 Personal history of colonic polyps: Secondary | ICD-10-CM

## 2020-03-20 NOTE — Telephone Encounter (Signed)
-----   Message from [name], MD sent at 03/19/2020 10:29 AM EST ----- Inform patient that he had all polyps taken out were adenomas i.e. precancerous.  In total he has had more than 10 precancerous polyps across 2 procedures.  Usually if you have more than 10 polyps we refer to genetic testing to determine if there is any genetic condition which can predispose 1 to have so many polyps.  If he is agreeable please refer him to the cancer center for genetic testing.  Repeat colonoscopy in 3 years unless genetic testing suggests otherwise

## 2020-03-20 NOTE — Telephone Encounter (Signed)
Pt has been notified of results and Dr. Georgeann Oppenheim recommendations. Pt agrees to genetic testing referral.

## 2020-03-24 ENCOUNTER — Inpatient Hospital Stay: Payer: BC Managed Care – PPO | Attending: Oncology | Admitting: Licensed Clinical Social Worker

## 2020-03-24 ENCOUNTER — Encounter: Payer: Self-pay | Admitting: Licensed Clinical Social Worker

## 2020-03-24 ENCOUNTER — Inpatient Hospital Stay: Payer: BC Managed Care – PPO

## 2020-03-24 DIAGNOSIS — Z803 Family history of malignant neoplasm of breast: Secondary | ICD-10-CM | POA: Diagnosis not present

## 2020-03-24 DIAGNOSIS — Z86010 Personal history of colonic polyps: Secondary | ICD-10-CM | POA: Diagnosis not present

## 2020-03-24 NOTE — Progress Notes (Signed)
REFERRING PROVIDER: [name], MD Stanley Whitney,  Stanley Whitney  PRIMARY PROVIDER:  [name], PA-C  PRIMARY REASON FOR VISIT:  1. Personal history of colonic polyps   2. Family history of breast cancer      HISTORY OF PRESENT ILLNESS:   Stanley Whitney, a 59 y.o. male, was seen for a [place] cancer genetics consultation at the request of Dr. Vicente Males due to a personal history of colon polyps.  Stanley Whitney presents to clinic today to discuss the possibility of a hereditary predisposition to cancer, genetic testing, and to further clarify his future cancer risks, as well as potential cancer risks for family members.    Stanley Whitney is a 59 y.o. male with no personal history of cancer.   He had 2 colonoscopies in 2018 that revealed 12 polyps, mostly tubular adenomas. He has a colonoscopy in 2021 that revealed 7 tubular adenomas.   CANCER HISTORY:  Oncology History   No history exists.    Past Medical History:  Diagnosis Date  . Bilateral incarcerated inguinal hernia   . Family history of breast cancer   . Heartburn   . History of kidney stones   . HLD (hyperlipidemia)   . Hypertension   . Kidney stone     Past Surgical History:  Procedure Laterality Date  . BLADDER REPAIR N/A 04/21/2017   Procedure: REPAIR BLADDER CYSTOTOMY;  Surgeon: Clovis Riley, MD;  Location: [place];  Service: General;  Laterality: N/A;  . COLONOSCOPY WITH PROPOFOL N/A 06/16/2016   Procedure: COLONOSCOPY WITH PROPOFOL;  Surgeon: [name], MD;  Location: [facility] ENDOSCOPY;  Service: Endoscopy;  Laterality: N/A;  . COLONOSCOPY WITH PROPOFOL N/A 10/04/2016   Procedure: COLONOSCOPY WITH PROPOFOL;  Surgeon: [name], MD;  Location: [facility] ENDOSCOPY;  Service: Endoscopy;  Laterality: N/A;  . COLONOSCOPY WITH PROPOFOL N/A 03/12/2020   Procedure: COLONOSCOPY WITH PROPOFOL;  Surgeon: [name], MD;  Location: [facility] ENDOSCOPY;  Service: Gastroenterology;  Laterality: N/A;  . COLONOSCOPY WITH  PROPOFOL N/A 03/13/2020   Procedure: COLONOSCOPY WITH PROPOFOL;  Surgeon: [name], MD;  Location: [facility] ENDOSCOPY;  Service: Gastroenterology;  Laterality: N/A;  . CYST REMOVAL TRUNK     Back  . EYE SURGERY     lasix  . INGUINAL HERNIA REPAIR Right 04/21/2017   Procedure: OPEN RIGHT INGUINAL HERNIA REPAIR WITH MESH;  Surgeon: Clovis Riley, MD;  Location: [place];  Service: General;  Laterality: Right;  . INSERTION OF MESH Right 04/21/2017   Procedure: INSERTION OF MESH; RIGHT INGUINAL;  Surgeon: Clovis Riley, MD;  Location: [place];  Service: General;  Laterality: Right;  . none      Social History   Socioeconomic History  . Marital status: Married    Spouse name: Not on file  . Number of children: Not on file  . Years of education: Not on file  . Highest education level: Not on file  Occupational History  . Not on file  Tobacco Use  . Smoking status: Former Research scientist (life sciences)  . Smokeless tobacco: Former Systems developer  . Tobacco comment: quit over 30 years  Vaping Use  . Vaping Use: Never used  Substance and Sexual Activity  . Alcohol use: Never    Comment: social  . Drug use: No  . Sexual activity: Not on file  Other Topics Concern  . Not on file  Social History Narrative  . Not on file   Social Determinants of Health   Financial  Resource Strain: Not on file  Food Insecurity: Not on file  Transportation Needs: Not on file  Physical Activity: Not on file  Stress: Not on file  Social Connections: Not on file     FAMILY HISTORY:  We obtained a detailed, 4-generation family history.  Significant diagnoses are listed below: Family History  Problem Relation Age of Onset  . Breast cancer Mother        dx 30s  . Cancer Brother 35       unk type  . Other Sister        brain tumor d. 62  . Kidney disease Neg Hx   . Prostate cancer Neg Hx    Stanley Whitney has a son, 52 and daughter, 42, no cancers and he does not believe they've had colonoscopies for any reason. He had 2 brothers  and 4 sisters. One brother did pass from cancer at 45, but patient is unsure the type. One sister passed due to a brain tumor.   Stanley Whitney mother had breast cancer in her 63s and died in her 31s. No known cancers on this side of the family, limited information.  Stanley Whitney father died in his late 71s and possibly had cancer. No known cancers on this side of the family, limited information.   Stanley Whitney is unaware of previous family history of genetic testing for hereditary cancer risks. Patient's maternal ancestors are of unknown descent, and paternal ancestors are of unknown descent. There is no reported Ashkenazi Jewish ancestry. There is no known consanguinity.    GENETIC COUNSELING ASSESSMENT: Stanley Whitney is a 59 y.o. male with a personal history of colon polyps which is somewhat suggestive of a hereditary polyposis syndrome and predisposition to cancer. We, therefore, discussed and recommended the following at today's visit.   DISCUSSION: We discussed that polyps in general are common, however, most people have fewer than 5 lifetime polyps.  When an individual has 10 or more polyps we become concerned about an underlying polyposis syndrome.  The most common hereditary polyposis syndromes are caused by problems in the APC and MUTYH genes, however, more recently, mutations in the [place] and MSH3 genes have been identified in some polyposis families. We discussed that testing is beneficial for several reasons including knowing how to follow individuals for cancer screenings, and understand if other family members could be at risk for cancer and allow them to undergo genetic testing.   We reviewed the characteristics, features and inheritance patterns of hereditary cancer syndromes. We also discussed genetic testing, including the appropriate family members to test, the process of testing, insurance coverage and turn-around-time for results. We discussed the implications of a negative, positive and/or  variant of uncertain significant result. We recommended Stanley Whitney pursue genetic testing for the CancerNext-Expanded+RNA gene panel.   The CancerNext-Expanded + RNAinsight gene panel offered by [employer] and includes sequencing and rearrangement analysis for the following 77 genes: IP, ALK, APC*, ATM*, AXIN2, BAP1, BARD1, BLM, BMPR1A, BRCA1*, BRCA2*, BRIP1*, CDC73, CDH1*,CDK4, CDKN1B, CDKN2A, CHEK2*, CTNNA1, DICER1, FANCC, FH, FLCN, GALNT12, KIF1B, LZTR1, MAX, MEN1, MET, MLH1*, MSH2*, MSH3, MSH6*, MUTYH*, NBN, NF1*, NF2, NTHL1, PALB2*, PHOX2B, PMS2*, POT1, PRKAR1A, PTCH1, PTEN*, RAD51C*, RAD51D*,RB1, RECQL, RET, SDHA, SDHAF2, SDHB, SDHC, SDHD, SMAD4, SMARCA4, SMARCB1, SMARCE1, STK11, SUFU, TMEM127, TP53*,TSC1, TSC2, VHL and XRCC2 (sequencing and deletion/duplication); EGFR, EGLN1, HOXB13, KIT, MITF, PDGFRA, POLD1 and POLE (sequencing only); EPCAM and GREM1 (deletion/duplication only). DNA and RNA analyses performed for * genes.  Based on Stanley Whitney  personal history of polyps, he meets medical criteria for genetic testing. Despite that he meets criteria, he may still have an out of pocket cost. We discussed that if his out of pocket cost for testing is over $100, the laboratory will call and confirm whether he wants to proceed with testing.  If the out of pocket cost of testing is less than $100 he will be billed by the genetic testing laboratory.   PLAN: After considering the risks, benefits, and limitations, Stanley Whitney provided informed consent to pursue genetic testing and the blood sample was sent to [facility] for analysis of the CancerNext-Expanded+RNA panel. Results should be available within approximately 2-3 weeks' time, at which point they will be disclosed by telephone to Stanley Whitney, as will any additional recommendations warranted by these results. Stanley Whitney will receive a summary of his genetic counseling visit and a copy of his results once available. This information will also be available  in Epic.   Stanley Whitney questions were answered to his satisfaction today. Our contact information was provided should additional questions or concerns arise. Thank you for the referral and allowing [place] to share in the care of your patient.   Faith Rogue, MS, [facility] Genetic Counselor [email or web address] Phone: [phone]  The patient was seen for a total of 30 minutes in face-to-face genetic counseling.  Dr. Grayland Ormond was available for discussion regarding this case.   _______________________________________________________________________ For Office Staff:  Number of people involved in session: 1 Was an Intern/ student involved with case: no

## 2020-04-10 ENCOUNTER — Other Ambulatory Visit: Payer: BC Managed Care – PPO

## 2020-04-14 ENCOUNTER — Ambulatory Visit: Payer: Self-pay | Admitting: Licensed Clinical Social Worker

## 2020-04-14 ENCOUNTER — Encounter: Payer: Self-pay | Admitting: Licensed Clinical Social Worker

## 2020-04-14 ENCOUNTER — Telehealth: Payer: Self-pay | Admitting: Licensed Clinical Social Worker

## 2020-04-14 DIAGNOSIS — Z1379 Encounter for other screening for genetic and chromosomal anomalies: Secondary | ICD-10-CM

## 2020-04-14 DIAGNOSIS — Z86010 Personal history of colonic polyps: Secondary | ICD-10-CM

## 2020-04-14 DIAGNOSIS — Z803 Family history of malignant neoplasm of breast: Secondary | ICD-10-CM

## 2020-04-14 NOTE — Progress Notes (Signed)
HPI:  Mr. Stanley Whitney was previously seen in the [place] clinic due to a personal history of colon polyps and concerns regarding a hereditary predisposition to cancer. Please refer to our prior cancer genetics clinic note for more information regarding our discussion, assessment and recommendations, at the time. Mr. Stanley Whitney recent genetic test results were disclosed to him, as were recommendations warranted by these results. These results and recommendations are discussed in more detail below.  CANCER HISTORY:  Oncology History   No history exists.    FAMILY HISTORY:  We obtained a detailed, 4-generation family history.  Significant diagnoses are listed below: Family History  Problem Relation Age of Onset  . Breast cancer Mother        dx 41s  . Cancer Brother 59       unk type  . Other Sister        brain tumor d. 45  . Kidney disease Neg Hx   . Prostate cancer Neg Hx     Mr. Stanley Whitney has a son, 65 and daughter, 57, no cancers and he does not believe they've had colonoscopies for any reason. He had 2 brothers and 4 sisters. One brother did pass from cancer at 80, but patient is unsure the type. One sister passed due to a brain tumor.   Mr. Stanley Whitney mother had breast cancer in her 11s and died in her 62s. No known cancers on this side of the family, limited information.  Mr. Stanley Whitney father died in his late 58s and possibly had cancer. No known cancers on this side of the family, limited information.   Mr. Stanley Whitney is unaware of previous family history of genetic testing for hereditary cancer risks. Patient's maternal ancestors are of unknown descent, and paternal ancestors are of unknown descent. There is no reported Ashkenazi Jewish ancestry. There is no known consanguinity.    GENETIC TEST RESULTS: Genetic testing reported out on 04/10/2020 through the [facility] CancerNext-Expanded+RNA cancer panel found no pathogenic mutations.   The CancerNext-Expanded + RNAinsight gene panel  offered by [employer] and includes sequencing and rearrangement analysis for the following 77 genes: IP, ALK, APC*, ATM*, AXIN2, BAP1, BARD1, BLM, BMPR1A, BRCA1*, BRCA2*, BRIP1*, CDC73, CDH1*,CDK4, CDKN1B, CDKN2A, CHEK2*, CTNNA1, DICER1, FANCC, FH, FLCN, GALNT12, KIF1B, LZTR1, MAX, MEN1, MET, MLH1*, MSH2*, MSH3, MSH6*, MUTYH*, NBN, NF1*, NF2, NTHL1, PALB2*, PHOX2B, PMS2*, POT1, PRKAR1A, PTCH1, PTEN*, RAD51C*, RAD51D*,RB1, RECQL, RET, SDHA, SDHAF2, SDHB, SDHC, SDHD, SMAD4, SMARCA4, SMARCB1, SMARCE1, STK11, SUFU, TMEM127, TP53*,TSC1, TSC2, VHL and XRCC2 (sequencing and deletion/duplication); EGFR, EGLN1, HOXB13, KIT, MITF, PDGFRA, POLD1 and POLE (sequencing only); EPCAM and GREM1 (deletion/duplication only).   The test report has been scanned into EPIC and is located under the Molecular Pathology section of the Results Review tab.  A portion of the result report is included below for reference.     We discussed with Mr. Stanley Whitney that because current genetic testing is not perfect, it is possible there may be a gene mutation in one of these genes that current testing cannot detect, but that chance is small.  We also discussed, that there could be another gene that has not yet been discovered, or that we have not yet tested, that is responsible for the cancer diagnoses in the family. It is also possible there is a hereditary cause for the cancer in the family that Mr. Stanley Whitney did not inherit and therefore was not identified in his testing.  Therefore, it is important to remain in touch with cancer genetics  in the future so that we can continue to offer [name] the most up to date genetic testing.   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  At this time, it is unknown if this variant is associated with increased cancer risk or if this is a normal finding, but most variants such as this get reclassified to being inconsequential. It should not be used to  make medical management decisions. With time, we suspect the lab will determine the significance of this variant, if any. If we do learn more about it, we will try to contact Mr. Stanley Whitney to discuss it further. However, it is important to stay in touch with [place] periodically and keep the address and phone number up to date.  ADDITIONAL GENETIC TESTING: We discussed with Mr. Stanley Whitney that his genetic testing was fairly extensive.  If there are genes identified to increase cancer risk that can be analyzed in the future, we would be happy to discuss and coordinate this testing at that time.    CANCER SCREENING RECOMMENDATIONS: Mr. Stanley Whitney test result is considered negative (normal).  This means that we have not identified a hereditary cause for his  personal history of polyps and family history of cancer at this time.   While reassuring, this does not definitively rule out a hereditary predisposition to cancer. It is still possible that there could be genetic mutations that are undetectable by current technology. There could be genetic mutations in genes that have not been tested or identified to increase cancer risk.  Therefore, it is recommended he continue to follow the cancer management and screening guidelines provided by his  primary healthcare provider.   This negative genetic test simply tells [place] that we cannot yet define why Mr. Stanley Whitney has had an increased number of colorectal polyps.  Mr. Stanley Whitney medical management and screening should be based on the prospect that he will likely form more colon polyps and should, therefore, undergo more frequent colonoscopy screening at intervals determined by his GI providers.  We also recommended that Mr. Stanley Whitney have an upper endoscopy periodically.  An individual's cancer risk and medical management are not determined by genetic test results alone. Overall cancer risk assessment incorporates additional factors, including personal medical history, family history, and any  available genetic information that may result in a personalized plan for cancer prevention and surveillance.  RECOMMENDATIONS FOR FAMILY MEMBERS:  Relatives in this family might be at some increased risk of developing cancer, over the general population risk, simply due to the family history of cancer.  We recommended male relatives in this family have a yearly mammogram beginning at age 25, or 20 years younger than the earliest onset of cancer, an annual clinical breast exam, and perform monthly breast self-exams. Male relatives in this family should also have a gynecological exam as recommended by their primary provider.  All family members should be referred for colonoscopy starting at age 84.   FOLLOW-UP: Lastly, we discussed with Mr. Oehlert that cancer genetics is a rapidly advancing field and it is possible that new genetic tests will be appropriate for him and/or his family members in the future. We encouraged him to remain in contact with cancer genetics on an annual basis so we can update his personal and family histories and let him know of advances in cancer genetics that may benefit this family.   Our contact number was provided. Mr. Saputo questions were answered to his satisfaction,  and he knows he is welcome to call us at anytime with additional questions or concerns.   Faith Rogue, MS, [facility] Genetic Counselor [email or web address] Phone: [phone]

## 2020-04-14 NOTE — Telephone Encounter (Signed)
Revealed negative genetic testing.  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  We discussed that we do not know why he has had colon polyps or why there is cancer in the family. It could be due to a different gene that we are not testing, or something our current technology cannot pick up.  It will be important for him to keep in contact with genetics to learn if additional testing may be needed in the future.

## 2020-05-11 ENCOUNTER — Ambulatory Visit (INDEPENDENT_AMBULATORY_CARE_PROVIDER_SITE_OTHER): Payer: BC Managed Care – PPO | Admitting: Internal Medicine

## 2020-05-11 VITALS — BP 142/81 | HR 64 | Temp 98.0°F | Resp 16 | Ht 68.0 in | Wt 209.0 lb

## 2020-05-11 DIAGNOSIS — Z9989 Dependence on other enabling machines and devices: Secondary | ICD-10-CM

## 2020-05-11 DIAGNOSIS — Z7189 Other specified counseling: Secondary | ICD-10-CM | POA: Insufficient documentation

## 2020-05-11 DIAGNOSIS — G4733 Obstructive sleep apnea (adult) (pediatric): Secondary | ICD-10-CM

## 2020-05-11 DIAGNOSIS — I10 Essential (primary) hypertension: Secondary | ICD-10-CM | POA: Diagnosis not present

## 2020-05-11 DIAGNOSIS — E669 Obesity, unspecified: Secondary | ICD-10-CM | POA: Diagnosis not present

## 2020-05-11 NOTE — Patient Instructions (Signed)

## 2020-05-11 NOTE — Progress Notes (Signed)
[facility] [place], [place] [address]  Pulmonary Sleep Medicine   Office Visit Note  Patient Name: Stanley Whitney DOB: 15-Apr-1960 MRN [id number]    Chief Complaint: Obstructive Sleep Apnea visit  Brief History:  [name] is seen today for follow up  The patient has a 3 year history of sleep apnea. Patient is using PAP nightly.  The patient feels more rested after sleeping with PAP.  The patient reports benefit from PAP use. Reported sleepiness is the same and the Epworth Sleepiness Score is 13 out of 24. The patient occasionally takes naps.  His use time has gone down. He has been sleeping on the couch for a few hours in the evening.The patient complains of the following: no complaints.  The compliance download shows excellent compliance with an average use time of 7.1 hours. The AHI is 0.5  The patient does not of limb movements disrupting sleep.  ROS  General: (-) fever, (-) chills, (-) night sweat Nose and Sinuses: (-) nasal stuffiness or itchiness, (-) postnasal drip, (-) nosebleeds, (-) sinus trouble. Mouth and Throat: (-) sore throat, (-) hoarseness. Neck: (-) swollen glands, (-) enlarged thyroid, (-) neck pain. Respiratory: - cough, - shortness of breath, - wheezing. Neurologic: - numbness, - tingling. Psychiatric: - anxiety, - depression   Current Medication: Outpatient Encounter Medications as of 05/11/2020  Medication Sig  . atenolol (TENORMIN) 25 MG tablet Take 25 mg by mouth daily.  . cetirizine (ZYRTEC) 10 MG tablet cetirizine 10 mg tablet  TAKE 1 TABLET BY MOUTH ONCE DAILY  . fluticasone (FLONASE) 50 MCG/ACT nasal spray Place into both nostrils.  . hydrochlorothiazide (HYDRODIURIL) 25 MG tablet Take 25 mg by mouth daily.  . Omega-3 Fatty Acids (FISH OIL) 1000 MG CAPS Take 2 capsules by mouth daily.  . polyethylene glycol (GOLYTELY) 236 g solution Drink 8 oz every 20-30 minutes until entire prep is finished  . rosuvastatin (CRESTOR) 10 MG  tablet rosuvastatin 10 mg tablet  TAKE 1 TABLET BY MOUTH IN THE EVENING  . rosuvastatin (CRESTOR) 5 MG tablet Take 5 mg by mouth every other day.   . tadalafil (CIALIS) 5 MG tablet Take 1 tablet (5 mg total) by mouth daily.   No facility-administered encounter medications on file as of 05/11/2020.    Surgical History: Past Surgical History:  Procedure Laterality Date  . BLADDER REPAIR N/A 04/21/2017   Procedure: REPAIR BLADDER CYSTOTOMY;  Surgeon: Clovis Riley, MD;  Location: [place];  Service: General;  Laterality: N/A;  . COLONOSCOPY WITH PROPOFOL N/A 06/16/2016   Procedure: COLONOSCOPY WITH PROPOFOL;  Surgeon: [name], MD;  Location: [facility] ENDOSCOPY;  Service: Endoscopy;  Laterality: N/A;  . COLONOSCOPY WITH PROPOFOL N/A 10/04/2016   Procedure: COLONOSCOPY WITH PROPOFOL;  Surgeon: [name], MD;  Location: [facility] ENDOSCOPY;  Service: Endoscopy;  Laterality: N/A;  . COLONOSCOPY WITH PROPOFOL N/A 03/12/2020   Procedure: COLONOSCOPY WITH PROPOFOL;  Surgeon: [name], MD;  Location: [facility] ENDOSCOPY;  Service: Gastroenterology;  Laterality: N/A;  . COLONOSCOPY WITH PROPOFOL N/A 03/13/2020   Procedure: COLONOSCOPY WITH PROPOFOL;  Surgeon: [name], MD;  Location: [facility] ENDOSCOPY;  Service: Gastroenterology;  Laterality: N/A;  . CYST REMOVAL TRUNK     Back  . EYE SURGERY     lasix  . INGUINAL HERNIA REPAIR Right 04/21/2017   Procedure: OPEN RIGHT INGUINAL HERNIA REPAIR WITH MESH;  Surgeon: Clovis Riley, MD;  Location: [place];  Service: General;  Laterality: Right;  . INSERTION  OF MESH Right 04/21/2017   Procedure: INSERTION OF MESH; RIGHT INGUINAL;  Surgeon: Clovis Riley, MD;  Location: [place];  Service: General;  Laterality: Right;  . none      Medical History: Past Medical History:  Diagnosis Date  . Bilateral incarcerated inguinal hernia   . Family history of breast cancer   . Heartburn   . History of kidney stones   . HLD (hyperlipidemia)   . Hypertension   . Kidney  stone     Family History: Non contributory to the present illness  Social History: Social History   Socioeconomic History  . Marital status: Married    Spouse name: Not on file  . Number of children: Not on file  . Years of education: Not on file  . Highest education level: Not on file  Occupational History  . Not on file  Tobacco Use  . Smoking status: Former Research scientist (life sciences)  . Smokeless tobacco: Former Systems developer  . Tobacco comment: quit over 30 years  Vaping Use  . Vaping Use: Never used  Substance and Sexual Activity  . Alcohol use: Never    Comment: social  . Drug use: No  . Sexual activity: Not on file  Other Topics Concern  . Not on file  Social History Narrative  . Not on file   Social Determinants of Health   Financial Resource Strain: Not on file  Food Insecurity: Not on file  Transportation Needs: Not on file  Physical Activity: Not on file  Stress: Not on file  Social Connections: Not on file  Intimate Partner Violence: Not on file    Vital Signs: Blood pressure (!) 142/81, pulse 64, temperature 98 F (36.7 C), temperature source Temporal, resp. rate 16, height 5\' 8"  (1.727 m), weight 209 lb (94.8 kg), SpO2 97 %.  Examination: General Appearance: The patient is well-developed, well-nourished, and in no distress. Neck Circumference:  Skin: Gross inspection of skin unremarkable. Head: normocephalic, no gross deformities. Eyes: no gross deformities noted. ENT: ears appear grossly normal Neurologic: Alert and oriented. No involuntary movements.    EPWORTH SLEEPINESS SCALE:  Scale:  (0)= no chance of dozing; (1)= slight chance of dozing; (2)= moderate chance of dozing; (3)= high chance of dozing  Chance  Situtation    Sitting and reading: 3    Watching TV: 2    Sitting Inactive in public: 2    As a passenger in car: 2      Lying down to rest: 2    Sitting and talking: 0    Sitting quielty after lunch: 2    In a car, stopped in traffic:  0   TOTAL SCORE:   13 out of 24    SLEEP STUDIES:  1. PSG 07/08/17  AHI 28 SpO88min 69%   CPAP COMPLIANCE DATA:  Date Range: 05/08/19-05/06/20  Average Daily Use: 7.1 hours  Median Use: 7.2  Compliance for > 4 Hours: 98%  AHI: 0.5 respiratory events per hour  Days Used: 364/365  Mask Leak: 6.9  95th Percentile Pressure: 12         LABS: Recent Results (from the past 2160 hour(s))  SARS CORONAVIRUS 2 (TAT 6-24 HRS) Nasopharyngeal Nasopharyngeal Swab     Status: None   Collection Time: 03/10/20  8:48 AM   Specimen: Nasopharyngeal Swab  Result Value Ref Range   SARS Coronavirus 2 NEGATIVE NEGATIVE    Comment: (NOTE) SARS-CoV-2 target nucleic acids are NOT DETECTED.  The SARS-CoV-2 RNA is generally  detectable in upper and lower respiratory specimens during the acute phase of infection. Negative results do not preclude SARS-CoV-2 infection, do not rule out co-infections with other pathogens, and should not be used as the sole basis for treatment or other patient management decisions. Negative results must be combined with clinical observations, patient history, and epidemiological information. The expected result is Negative.  Fact Sheet for Patients: SugarRoll.be  Fact Sheet for Healthcare Providers: https://www.woods-mathews.com/  This test is not yet approved or cleared by the [place] FDA and  has been authorized for detection and/or diagnosis of SARS-CoV-2 by FDA under an Emergency Use Authorization (EUA). This EUA will remain  in effect (meaning this test can be used) for the duration of the COVID-19 declaration under Se ction 564(b)(1) of the Act, 21 U.S.C. section 360bbb-3(b)(1), unless the authorization is terminated or revoked sooner.  Performed at [facility], [place] [address], [place], [place] [address]   Surgical pathology     Status: None   Collection Time: 03/13/20 11:36 AM  Result Value  Ref Range   SURGICAL PATHOLOGY      SURGICAL PATHOLOGY CASE: [id number] PATIENT: [facility] Surgical Pathology Report     Specimen Submitted: A. Colon polyps x5, ascending; cs B. Colon polyp x1, transverse; cs C. Colon polyp, rectum; cs  Clinical History: Previous history of colon polyps, colon polyps, hemorrhoids    DIAGNOSIS: A. COLON POLYPS X5, ASCENDING; COLD SNARE: - FRAGMENTS (X5) OF TUBULAR ADENOMAS. - NEGATIVE FOR HIGH-GRADE DYSPLASIA AND MALIGNANCY.  B. COLON POLYP, TRANSVERSE; COLD SNARE: - TUBULAR ADENOMA. - NEGATIVE FOR HIGH-GRADE DYSPLASIA AND MALIGNANCY.  C. RECTAL POLYP; COLD SNARE: - TUBULAR ADENOMA. - NEGATIVE FOR HIGH-GRADE DYSPLASIA AND MALIGNANCY.  GROSS DESCRIPTION: A. Labeled: Ascending polyps x5, cold snare Received: In formalin Tissue fragment(s): 5 Size: 1 x 0.5 x 0.1 cm Description: Aggregate of pink-tan polypoid fragments Entirely submitted in 1 cassette.  B. Labeled: Transverse polyp x1 cold snare Received: In formalin Tissue fragment(s): 1 Size: 0 .3 cm Description: Tan polypoid fragment Entirely submitted in 1 cassette.  C. Labeled: Rectal polyp x1, cold snare Received: In formalin Tissue fragment(s): 2 Size: 0.4 x 0.3 x 0.2 cm Description: Aggregate of tan polypoid fragments Entirely submitted in 1 cassette.   Final Diagnosis performed by Allena Napoleon, MD.   Electronically signed 03/16/2020 10:08:07AM The electronic signature indicates that the named Attending Pathologist has evaluated the specimen Technical component performed at [facility], [address] [place], [place] [address] Lab: [phone] Dir: Rush Farmer, MD, MMM  Professional component performed at [facility], [facility], [place], [place], [place] [address] Lab: [phone] Dir: Dellia Nims. Reuel Derby, MD     Radiology: No results found.  No results found.  No results found.    Assessment and Plan: Patient Active Problem  List   Diagnosis Date Noted  . OSA on CPAP 05/11/2020  . CPAP use counseling 05/11/2020  . Hypertension 05/11/2020  . Obesity (BMI 30-39.9) 05/11/2020  . Genetic testing 04/14/2020  . Family history of breast cancer   . Lateral epicondylitis 02/21/2019  . Sebaceous cyst 02/21/2019  . S/P bladder repair 04/21/2017  . Polyp of sigmoid colon   . Benign neoplasm of descending colon   . Special screening for malignant neoplasms, colon   . Benign neoplasm of ascending colon   . Screening for colon cancer   . Benign neoplasm of cecum   . Tear of medial meniscus of knee 02/22/2016  The patient does tolerate PAP and reports significant benefit from PAP use. The patient was reminded how to clean the unit and advised to set an alarm when he sits in the living room to prevent sleeping for hours there.. The patient was also counselled the importance of watching his diet. The compliance is excellent . The apnea is well controlled.   1. OSA- continue excellent compliance. 2. CPAP use counseling- Pt reports good compliance with CPAP therapy. Cleaning machine by hand, and changing filters and tubing as directed. Denies headaches, sinus issues, palpitations, or hemoptysis.   3. Hypertension- discussed elevated blood pressure in office today. He will monitor and discuss with PCP.  4. Obesity- counseled as to diet.    General Counseling: I have discussed the findings of the evaluation and examination with Jarious.  I have also discussed any further diagnostic evaluation thatmay be needed or ordered today. Estiven verbalizes understanding of the findings of todays visit. We also reviewed his medications today and discussed drug interactions and side effects including but not limited excessive drowsiness and altered mental states. We also discussed that there is always a risk not just to him but also people around him. he has been encouraged to call the office with any questions or concerns that should  arise related to todays visit.  No orders of the defined types were placed in this encounter.       I have personally obtained a history, examined the patient, evaluated laboratory and imaging results, formulated the assessment and plan and placed orders.  This patient was seen today by Tressie Ellis, PA-C in collaboration with Dr. Devona Konig.    Richelle Ito Saunders Glance, PhD, FAASM  Diplomate, American Board of Sleep Medicine    Allyne Gee, MD [facility] Diplomate ABMS Pulmonary and Critical Care Medicine Sleep medicine

## 2020-06-30 DIAGNOSIS — M170 Bilateral primary osteoarthritis of knee: Secondary | ICD-10-CM | POA: Diagnosis not present

## 2020-07-06 DIAGNOSIS — N486 Induration penis plastica: Secondary | ICD-10-CM | POA: Diagnosis not present

## 2020-08-07 DIAGNOSIS — G4733 Obstructive sleep apnea (adult) (pediatric): Secondary | ICD-10-CM | POA: Diagnosis not present

## 2020-08-07 DIAGNOSIS — K219 Gastro-esophageal reflux disease without esophagitis: Secondary | ICD-10-CM | POA: Diagnosis not present

## 2020-08-07 DIAGNOSIS — Z79899 Other long term (current) drug therapy: Secondary | ICD-10-CM | POA: Diagnosis not present

## 2020-08-07 DIAGNOSIS — N486 Induration penis plastica: Secondary | ICD-10-CM | POA: Diagnosis not present

## 2020-08-07 DIAGNOSIS — I10 Essential (primary) hypertension: Secondary | ICD-10-CM | POA: Diagnosis not present

## 2020-08-07 DIAGNOSIS — E785 Hyperlipidemia, unspecified: Secondary | ICD-10-CM | POA: Diagnosis not present

## 2020-08-11 DIAGNOSIS — M170 Bilateral primary osteoarthritis of knee: Secondary | ICD-10-CM | POA: Diagnosis not present

## 2020-09-03 DIAGNOSIS — Z09 Encounter for follow-up examination after completed treatment for conditions other than malignant neoplasm: Secondary | ICD-10-CM | POA: Diagnosis not present

## 2020-09-03 DIAGNOSIS — N486 Induration penis plastica: Secondary | ICD-10-CM | POA: Diagnosis not present

## 2020-09-04 DIAGNOSIS — R7303 Prediabetes: Secondary | ICD-10-CM | POA: Diagnosis not present

## 2020-09-04 DIAGNOSIS — G4733 Obstructive sleep apnea (adult) (pediatric): Secondary | ICD-10-CM | POA: Diagnosis not present

## 2020-09-04 DIAGNOSIS — I10 Essential (primary) hypertension: Secondary | ICD-10-CM | POA: Diagnosis not present

## 2020-09-04 DIAGNOSIS — E7800 Pure hypercholesterolemia, unspecified: Secondary | ICD-10-CM | POA: Diagnosis not present

## 2020-09-04 DIAGNOSIS — N529 Male erectile dysfunction, unspecified: Secondary | ICD-10-CM | POA: Diagnosis not present

## 2020-09-21 DIAGNOSIS — M1711 Unilateral primary osteoarthritis, right knee: Secondary | ICD-10-CM | POA: Diagnosis not present

## 2020-09-30 DIAGNOSIS — M25561 Pain in right knee: Secondary | ICD-10-CM | POA: Diagnosis not present

## 2020-10-19 DIAGNOSIS — M1711 Unilateral primary osteoarthritis, right knee: Secondary | ICD-10-CM | POA: Diagnosis not present

## 2020-10-21 ENCOUNTER — Emergency Department (HOSPITAL_COMMUNITY)
Admission: EM | Admit: 2020-10-21 | Discharge: 2020-10-21 | Disposition: A | Discharge status: Home / Self Care | Payer: BC Managed Care – PPO | Attending: Emergency Medicine | Admitting: Emergency Medicine

## 2020-10-21 ENCOUNTER — Emergency Department (HOSPITAL_COMMUNITY): Payer: BC Managed Care – PPO

## 2020-10-21 ENCOUNTER — Encounter (HOSPITAL_COMMUNITY): Payer: Self-pay | Admitting: Emergency Medicine

## 2020-10-21 ENCOUNTER — Other Ambulatory Visit: Payer: Self-pay

## 2020-10-21 DIAGNOSIS — N3289 Other specified disorders of bladder: Secondary | ICD-10-CM | POA: Diagnosis not present

## 2020-10-21 DIAGNOSIS — M79661 Pain in right lower leg: Secondary | ICD-10-CM | POA: Diagnosis not present

## 2020-10-21 DIAGNOSIS — Z79899 Other long term (current) drug therapy: Secondary | ICD-10-CM | POA: Diagnosis not present

## 2020-10-21 DIAGNOSIS — N281 Cyst of kidney, acquired: Secondary | ICD-10-CM | POA: Diagnosis not present

## 2020-10-21 DIAGNOSIS — J9811 Atelectasis: Secondary | ICD-10-CM | POA: Diagnosis not present

## 2020-10-21 DIAGNOSIS — M2578 Osteophyte, vertebrae: Secondary | ICD-10-CM | POA: Diagnosis not present

## 2020-10-21 DIAGNOSIS — R109 Unspecified abdominal pain: Secondary | ICD-10-CM | POA: Insufficient documentation

## 2020-10-21 DIAGNOSIS — M47814 Spondylosis without myelopathy or radiculopathy, thoracic region: Secondary | ICD-10-CM | POA: Diagnosis not present

## 2020-10-21 DIAGNOSIS — R0789 Other chest pain: Secondary | ICD-10-CM | POA: Insufficient documentation

## 2020-10-21 DIAGNOSIS — Z87891 Personal history of nicotine dependence: Secondary | ICD-10-CM | POA: Insufficient documentation

## 2020-10-21 DIAGNOSIS — I517 Cardiomegaly: Secondary | ICD-10-CM | POA: Diagnosis not present

## 2020-10-21 DIAGNOSIS — M79662 Pain in left lower leg: Secondary | ICD-10-CM | POA: Diagnosis not present

## 2020-10-21 DIAGNOSIS — S79922A Unspecified injury of left thigh, initial encounter: Secondary | ICD-10-CM | POA: Diagnosis not present

## 2020-10-21 DIAGNOSIS — I10 Essential (primary) hypertension: Secondary | ICD-10-CM | POA: Diagnosis not present

## 2020-10-21 DIAGNOSIS — Z041 Encounter for examination and observation following transport accident: Secondary | ICD-10-CM | POA: Diagnosis not present

## 2020-10-21 DIAGNOSIS — M542 Cervicalgia: Secondary | ICD-10-CM | POA: Insufficient documentation

## 2020-10-21 DIAGNOSIS — Y92410 Unspecified street and highway as the place of occurrence of the external cause: Secondary | ICD-10-CM | POA: Insufficient documentation

## 2020-10-21 DIAGNOSIS — M546 Pain in thoracic spine: Secondary | ICD-10-CM | POA: Diagnosis not present

## 2020-10-21 DIAGNOSIS — M545 Low back pain, unspecified: Secondary | ICD-10-CM | POA: Insufficient documentation

## 2020-10-21 DIAGNOSIS — S79921A Unspecified injury of right thigh, initial encounter: Secondary | ICD-10-CM | POA: Diagnosis not present

## 2020-10-21 DIAGNOSIS — M47812 Spondylosis without myelopathy or radiculopathy, cervical region: Secondary | ICD-10-CM | POA: Diagnosis not present

## 2020-10-21 DIAGNOSIS — S0001XA Abrasion of scalp, initial encounter: Secondary | ICD-10-CM | POA: Insufficient documentation

## 2020-10-21 DIAGNOSIS — S0990XA Unspecified injury of head, initial encounter: Secondary | ICD-10-CM | POA: Diagnosis not present

## 2020-10-21 DIAGNOSIS — Z20822 Contact with and (suspected) exposure to covid-19: Secondary | ICD-10-CM | POA: Insufficient documentation

## 2020-10-21 DIAGNOSIS — M79652 Pain in left thigh: Secondary | ICD-10-CM | POA: Diagnosis not present

## 2020-10-21 DIAGNOSIS — M4802 Spinal stenosis, cervical region: Secondary | ICD-10-CM | POA: Diagnosis not present

## 2020-10-21 DIAGNOSIS — T1490XA Injury, unspecified, initial encounter: Secondary | ICD-10-CM

## 2020-10-21 DIAGNOSIS — I719 Aortic aneurysm of unspecified site, without rupture: Secondary | ICD-10-CM | POA: Diagnosis not present

## 2020-10-21 LAB — COMPREHENSIVE METABOLIC PANEL
ALT: 47 U/L — ABNORMAL HIGH (ref 0–44)
AST: 31 U/L (ref 15–41)
Albumin: 3.7 g/dL (ref 3.5–5.0)
Alkaline Phosphatase: 48 U/L (ref 38–126)
Anion gap: 10 (ref 5–15)
BUN: 14 mg/dL (ref 6–20)
CO2: 28 mmol/L (ref 22–32)
Calcium: 9.6 mg/dL (ref 8.9–10.3)
Chloride: 101 mmol/L (ref 98–111)
Creatinine, Ser: 1.27 mg/dL — ABNORMAL HIGH (ref 0.61–1.24)
GFR, Estimated: >60 mL/min (ref >60)
Glucose, Bld: 125 mg/dL — ABNORMAL HIGH (ref 70–99)
Potassium: 3.4 mmol/L — ABNORMAL LOW (ref 3.5–5.1)
Sodium: 139 mmol/L (ref 135–145)
Total Bilirubin: 0.8 mg/dL (ref 0.3–1.2)
Total Protein: 7.4 g/dL (ref 6.5–8.1)

## 2020-10-21 LAB — I-STAT CHEM 8, ED
BUN: 21 mg/dL — ABNORMAL HIGH (ref 6–20)
Calcium, Ion: 1.15 mmol/L (ref 1.15–1.40)
Chloride: 99 mmol/L (ref 98–111)
Creatinine, Ser: 1.3 mg/dL — ABNORMAL HIGH (ref 0.61–1.24)
Glucose, Bld: 131 mg/dL — ABNORMAL HIGH (ref 70–99)
HCT: 48 % (ref 39.0–52.0)
Hemoglobin: 16.3 g/dL (ref 13.0–17.0)
Potassium: 3.5 mmol/L (ref 3.5–5.1)
Sodium: 139 mmol/L (ref 135–145)
TCO2: 30 mmol/L (ref 22–32)

## 2020-10-21 LAB — CBC
HCT: 45.7 % (ref 39.0–52.0)
Hemoglobin: 15.4 g/dL (ref 13.0–17.0)
MCH: 31.2 pg (ref 26.0–34.0)
MCHC: 33.7 g/dL (ref 30.0–36.0)
MCV: 92.5 fL (ref 80.0–100.0)
Platelets: 278 10*3/uL (ref 150–400)
RBC: 4.94 MIL/uL (ref 4.22–5.81)
RDW: 13.2 % (ref 11.5–15.5)
WBC: 7.7 10*3/uL (ref 4.0–10.5)
nRBC: 0 % (ref 0.0–0.2)

## 2020-10-21 LAB — ETHANOL: Alcohol, Ethyl (B): <10 mg/dL (ref <10)

## 2020-10-21 LAB — PROTIME-INR
INR: 1 (ref 0.8–1.2)
Prothrombin Time: 13.4 seconds (ref 11.4–15.2)

## 2020-10-21 LAB — RESP PANEL BY RT-PCR (FLU A&B, COVID) ARPGX2
Influenza A by PCR: NEGATIVE
Influenza B by PCR: NEGATIVE
SARS Coronavirus 2 by RT PCR: NEGATIVE

## 2020-10-21 LAB — SAMPLE TO BLOOD BANK

## 2020-10-21 LAB — LACTIC ACID, PLASMA: Lactic Acid, Venous: 2.1 mmol/L (ref 0.5–1.9)

## 2020-10-21 MED ORDER — IOHEXOL 300 MG/ML  SOLN
100.0000 mL | Freq: Once | INTRAMUSCULAR | Status: AC | PRN
  Administered 2020-10-21: 100 mL via INTRAVENOUS

## 2020-10-21 MED ORDER — NAPROXEN 500 MG PO TABS: 500.0000 mg | ORAL_TABLET | Freq: Two times a day (BID) | ORAL | 0 refills | Status: AC

## 2020-10-21 MED ORDER — HYDROMORPHONE HCL 1 MG/ML IJ SOLN
1.0000 mg | Freq: Once | INTRAMUSCULAR | Status: AC
  Administered 2020-10-21: 1 mg via INTRAVENOUS
  Filled 2020-10-21: qty 1

## 2020-10-21 MED ORDER — BACITRACIN ZINC 500 UNIT/GM EX OINT
1.0000 "application " | TOPICAL_OINTMENT | Freq: Once | CUTANEOUS | Status: AC
  Administered 2020-10-21: 1 via TOPICAL
  Filled 2020-10-21: qty 0.9

## 2020-10-21 MED ORDER — CYCLOBENZAPRINE HCL 10 MG PO TABS: 10.0000 mg | ORAL_TABLET | Freq: Two times a day (BID) | ORAL | 0 refills | Status: DC | PRN

## 2020-10-21 MED ORDER — HYDROCODONE-ACETAMINOPHEN 5-325 MG PO TABS: 1.0000 | ORAL_TABLET | Freq: Four times a day (QID) | ORAL | 0 refills | Status: DC | PRN

## 2020-10-21 NOTE — ED Notes (Signed)
Abrasions on pt's head cleaned w/NS, bacitracin and a dry dressing applied.

## 2020-10-21 NOTE — ED Notes (Signed)
Patient transported to X-ray 
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
## 2020-10-21 NOTE — ED Notes (Signed)
Pt has superficial abrasions going across the middle top of his head. Bleeding is controlled.
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
## 2020-10-21 NOTE — Discharge Instructions (Addendum)
All of your x-rays and scans did not show any signs of serious injury .   An incidental enlargement of the aorta was noted.  The radiologist recommended annual CT scan imaging to make sure it is stable in size

## 2020-10-21 NOTE — ED Notes (Signed)
Patient transported to CT 
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
## 2020-10-28 DIAGNOSIS — I10 Essential (primary) hypertension: Secondary | ICD-10-CM | POA: Diagnosis not present

## 2020-10-28 DIAGNOSIS — S139XXA Sprain of joints and ligaments of unspecified parts of neck, initial encounter: Secondary | ICD-10-CM | POA: Diagnosis not present

## 2020-10-28 DIAGNOSIS — I7789 Other specified disorders of arteries and arterioles: Secondary | ICD-10-CM | POA: Diagnosis not present

## 2020-10-28 DIAGNOSIS — S239XXA Sprain of unspecified parts of thorax, initial encounter: Secondary | ICD-10-CM | POA: Diagnosis not present

## 2020-12-01 DIAGNOSIS — M1711 Unilateral primary osteoarthritis, right knee: Secondary | ICD-10-CM | POA: Diagnosis not present

## 2020-12-03 DIAGNOSIS — M1711 Unilateral primary osteoarthritis, right knee: Secondary | ICD-10-CM | POA: Diagnosis not present

## 2020-12-10 DIAGNOSIS — Z87891 Personal history of nicotine dependence: Secondary | ICD-10-CM | POA: Diagnosis not present

## 2020-12-10 DIAGNOSIS — N189 Chronic kidney disease, unspecified: Secondary | ICD-10-CM | POA: Diagnosis not present

## 2020-12-10 DIAGNOSIS — I129 Hypertensive chronic kidney disease with stage 1 through stage 4 chronic kidney disease, or unspecified chronic kidney disease: Secondary | ICD-10-CM | POA: Diagnosis not present

## 2020-12-10 DIAGNOSIS — M1711 Unilateral primary osteoarthritis, right knee: Secondary | ICD-10-CM | POA: Diagnosis not present

## 2020-12-10 DIAGNOSIS — M25561 Pain in right knee: Secondary | ICD-10-CM | POA: Diagnosis not present

## 2020-12-10 DIAGNOSIS — G4733 Obstructive sleep apnea (adult) (pediatric): Secondary | ICD-10-CM | POA: Diagnosis not present

## 2020-12-10 DIAGNOSIS — G8918 Other acute postprocedural pain: Secondary | ICD-10-CM | POA: Diagnosis not present

## 2020-12-10 DIAGNOSIS — I10 Essential (primary) hypertension: Secondary | ICD-10-CM | POA: Diagnosis not present

## 2020-12-10 DIAGNOSIS — E785 Hyperlipidemia, unspecified: Secondary | ICD-10-CM | POA: Diagnosis not present

## 2020-12-11 DIAGNOSIS — Z87891 Personal history of nicotine dependence: Secondary | ICD-10-CM | POA: Diagnosis not present

## 2020-12-11 DIAGNOSIS — I10 Essential (primary) hypertension: Secondary | ICD-10-CM | POA: Diagnosis not present

## 2020-12-11 DIAGNOSIS — E785 Hyperlipidemia, unspecified: Secondary | ICD-10-CM | POA: Diagnosis not present

## 2020-12-11 DIAGNOSIS — N189 Chronic kidney disease, unspecified: Secondary | ICD-10-CM | POA: Diagnosis not present

## 2020-12-11 DIAGNOSIS — I129 Hypertensive chronic kidney disease with stage 1 through stage 4 chronic kidney disease, or unspecified chronic kidney disease: Secondary | ICD-10-CM | POA: Diagnosis not present

## 2020-12-11 DIAGNOSIS — G4733 Obstructive sleep apnea (adult) (pediatric): Secondary | ICD-10-CM | POA: Diagnosis not present

## 2020-12-11 DIAGNOSIS — M1711 Unilateral primary osteoarthritis, right knee: Secondary | ICD-10-CM | POA: Diagnosis not present

## 2020-12-17 DIAGNOSIS — M1711 Unilateral primary osteoarthritis, right knee: Secondary | ICD-10-CM | POA: Diagnosis not present

## 2020-12-22 DIAGNOSIS — M1711 Unilateral primary osteoarthritis, right knee: Secondary | ICD-10-CM | POA: Diagnosis not present

## 2020-12-24 DIAGNOSIS — M1711 Unilateral primary osteoarthritis, right knee: Secondary | ICD-10-CM | POA: Diagnosis not present

## 2020-12-30 DIAGNOSIS — M1711 Unilateral primary osteoarthritis, right knee: Secondary | ICD-10-CM | POA: Diagnosis not present

## 2021-01-01 DIAGNOSIS — M1711 Unilateral primary osteoarthritis, right knee: Secondary | ICD-10-CM | POA: Diagnosis not present

## 2021-01-06 DIAGNOSIS — M1711 Unilateral primary osteoarthritis, right knee: Secondary | ICD-10-CM | POA: Diagnosis not present

## 2021-01-08 DIAGNOSIS — M1711 Unilateral primary osteoarthritis, right knee: Secondary | ICD-10-CM | POA: Diagnosis not present

## 2021-01-13 DIAGNOSIS — M1711 Unilateral primary osteoarthritis, right knee: Secondary | ICD-10-CM | POA: Diagnosis not present

## 2021-01-15 DIAGNOSIS — M1711 Unilateral primary osteoarthritis, right knee: Secondary | ICD-10-CM | POA: Diagnosis not present

## 2021-01-19 DIAGNOSIS — Z96651 Presence of right artificial knee joint: Secondary | ICD-10-CM | POA: Diagnosis not present

## 2021-01-19 DIAGNOSIS — M1711 Unilateral primary osteoarthritis, right knee: Secondary | ICD-10-CM | POA: Diagnosis not present

## 2021-01-21 DIAGNOSIS — M1711 Unilateral primary osteoarthritis, right knee: Secondary | ICD-10-CM | POA: Diagnosis not present

## 2021-01-26 DIAGNOSIS — M25561 Pain in right knee: Secondary | ICD-10-CM | POA: Diagnosis not present

## 2021-01-26 DIAGNOSIS — M25661 Stiffness of right knee, not elsewhere classified: Secondary | ICD-10-CM | POA: Diagnosis not present

## 2021-01-28 DIAGNOSIS — M25661 Stiffness of right knee, not elsewhere classified: Secondary | ICD-10-CM | POA: Diagnosis not present

## 2021-01-28 DIAGNOSIS — M25561 Pain in right knee: Secondary | ICD-10-CM | POA: Diagnosis not present

## 2021-02-03 DIAGNOSIS — M25661 Stiffness of right knee, not elsewhere classified: Secondary | ICD-10-CM | POA: Diagnosis not present

## 2021-02-03 DIAGNOSIS — M25561 Pain in right knee: Secondary | ICD-10-CM | POA: Diagnosis not present

## 2021-02-08 DIAGNOSIS — M25661 Stiffness of right knee, not elsewhere classified: Secondary | ICD-10-CM | POA: Diagnosis not present

## 2021-02-08 DIAGNOSIS — M25561 Pain in right knee: Secondary | ICD-10-CM | POA: Diagnosis not present

## 2021-02-10 DIAGNOSIS — M25661 Stiffness of right knee, not elsewhere classified: Secondary | ICD-10-CM | POA: Diagnosis not present

## 2021-02-10 DIAGNOSIS — M25561 Pain in right knee: Secondary | ICD-10-CM | POA: Diagnosis not present

## 2021-02-15 DIAGNOSIS — M25561 Pain in right knee: Secondary | ICD-10-CM | POA: Diagnosis not present

## 2021-02-15 DIAGNOSIS — M25661 Stiffness of right knee, not elsewhere classified: Secondary | ICD-10-CM | POA: Diagnosis not present

## 2021-02-16 DIAGNOSIS — M2042 Other hammer toe(s) (acquired), left foot: Secondary | ICD-10-CM | POA: Diagnosis not present

## 2021-02-16 DIAGNOSIS — L603 Nail dystrophy: Secondary | ICD-10-CM | POA: Diagnosis not present

## 2021-02-17 DIAGNOSIS — M25561 Pain in right knee: Secondary | ICD-10-CM | POA: Diagnosis not present

## 2021-02-17 DIAGNOSIS — M25661 Stiffness of right knee, not elsewhere classified: Secondary | ICD-10-CM | POA: Diagnosis not present

## 2021-02-22 DIAGNOSIS — M25661 Stiffness of right knee, not elsewhere classified: Secondary | ICD-10-CM | POA: Diagnosis not present

## 2021-02-22 DIAGNOSIS — M25561 Pain in right knee: Secondary | ICD-10-CM | POA: Diagnosis not present

## 2021-02-24 DIAGNOSIS — M25561 Pain in right knee: Secondary | ICD-10-CM | POA: Diagnosis not present

## 2021-02-24 DIAGNOSIS — M25661 Stiffness of right knee, not elsewhere classified: Secondary | ICD-10-CM | POA: Diagnosis not present

## 2021-03-01 DIAGNOSIS — M25661 Stiffness of right knee, not elsewhere classified: Secondary | ICD-10-CM | POA: Diagnosis not present

## 2021-03-01 DIAGNOSIS — M25561 Pain in right knee: Secondary | ICD-10-CM | POA: Diagnosis not present

## 2021-03-04 DIAGNOSIS — M1711 Unilateral primary osteoarthritis, right knee: Secondary | ICD-10-CM | POA: Diagnosis not present

## 2021-03-10 DIAGNOSIS — M25561 Pain in right knee: Secondary | ICD-10-CM | POA: Diagnosis not present

## 2021-03-10 DIAGNOSIS — M25661 Stiffness of right knee, not elsewhere classified: Secondary | ICD-10-CM | POA: Diagnosis not present

## 2021-03-10 DIAGNOSIS — N529 Male erectile dysfunction, unspecified: Secondary | ICD-10-CM | POA: Diagnosis not present

## 2021-03-10 DIAGNOSIS — E7800 Pure hypercholesterolemia, unspecified: Secondary | ICD-10-CM | POA: Diagnosis not present

## 2021-03-10 DIAGNOSIS — I10 Essential (primary) hypertension: Secondary | ICD-10-CM | POA: Diagnosis not present

## 2021-03-10 DIAGNOSIS — G4733 Obstructive sleep apnea (adult) (pediatric): Secondary | ICD-10-CM | POA: Diagnosis not present

## 2021-03-10 DIAGNOSIS — Z125 Encounter for screening for malignant neoplasm of prostate: Secondary | ICD-10-CM | POA: Diagnosis not present

## 2021-03-10 DIAGNOSIS — R7303 Prediabetes: Secondary | ICD-10-CM | POA: Diagnosis not present

## 2021-03-10 DIAGNOSIS — M1711 Unilateral primary osteoarthritis, right knee: Secondary | ICD-10-CM | POA: Diagnosis not present

## 2021-03-15 DIAGNOSIS — M25661 Stiffness of right knee, not elsewhere classified: Secondary | ICD-10-CM | POA: Diagnosis not present

## 2021-03-15 DIAGNOSIS — M1711 Unilateral primary osteoarthritis, right knee: Secondary | ICD-10-CM | POA: Diagnosis not present

## 2021-03-15 DIAGNOSIS — M25561 Pain in right knee: Secondary | ICD-10-CM | POA: Diagnosis not present

## 2021-03-17 DIAGNOSIS — M1711 Unilateral primary osteoarthritis, right knee: Secondary | ICD-10-CM | POA: Diagnosis not present

## 2021-03-17 DIAGNOSIS — M25561 Pain in right knee: Secondary | ICD-10-CM | POA: Diagnosis not present

## 2021-03-17 DIAGNOSIS — M25661 Stiffness of right knee, not elsewhere classified: Secondary | ICD-10-CM | POA: Diagnosis not present

## 2021-03-22 DIAGNOSIS — M25661 Stiffness of right knee, not elsewhere classified: Secondary | ICD-10-CM | POA: Diagnosis not present

## 2021-03-22 DIAGNOSIS — M25561 Pain in right knee: Secondary | ICD-10-CM | POA: Diagnosis not present

## 2021-03-24 DIAGNOSIS — M25661 Stiffness of right knee, not elsewhere classified: Secondary | ICD-10-CM | POA: Diagnosis not present

## 2021-03-24 DIAGNOSIS — M25561 Pain in right knee: Secondary | ICD-10-CM | POA: Diagnosis not present

## 2021-04-13 DIAGNOSIS — Z96651 Presence of right artificial knee joint: Secondary | ICD-10-CM | POA: Diagnosis not present

## 2021-05-24 ENCOUNTER — Encounter: Payer: Self-pay | Admitting: Licensed Clinical Social Worker

## 2021-05-24 NOTE — Progress Notes (Signed)
VUS in [facility] called [id number]>A has been reclassified to "Likely Benign." The report date is 05/21/2021.

## 2021-07-05 ENCOUNTER — Ambulatory Visit (INDEPENDENT_AMBULATORY_CARE_PROVIDER_SITE_OTHER): Payer: BC Managed Care – PPO | Admitting: Internal Medicine

## 2021-07-05 VITALS — BP 132/71 | HR 71 | Resp 16 | Ht 68.0 in | Wt 210.0 lb

## 2021-07-05 DIAGNOSIS — G4733 Obstructive sleep apnea (adult) (pediatric): Secondary | ICD-10-CM | POA: Diagnosis not present

## 2021-07-05 DIAGNOSIS — Z7189 Other specified counseling: Secondary | ICD-10-CM | POA: Diagnosis not present

## 2021-07-05 DIAGNOSIS — E669 Obesity, unspecified: Secondary | ICD-10-CM

## 2021-07-05 DIAGNOSIS — I10 Essential (primary) hypertension: Secondary | ICD-10-CM | POA: Diagnosis not present

## 2021-07-05 DIAGNOSIS — Z9989 Dependence on other enabling machines and devices: Secondary | ICD-10-CM
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
## 2021-07-05 NOTE — Patient Instructions (Signed)

## 2021-09-09 DIAGNOSIS — I10 Essential (primary) hypertension: Secondary | ICD-10-CM | POA: Diagnosis not present

## 2021-09-09 DIAGNOSIS — I7789 Other specified disorders of arteries and arterioles: Secondary | ICD-10-CM | POA: Diagnosis not present

## 2021-09-09 DIAGNOSIS — E7800 Pure hypercholesterolemia, unspecified: Secondary | ICD-10-CM | POA: Diagnosis not present

## 2021-09-09 DIAGNOSIS — R7303 Prediabetes: Secondary | ICD-10-CM | POA: Diagnosis not present

## 2021-10-18 ENCOUNTER — Other Ambulatory Visit: Payer: Self-pay | Admitting: Physician Assistant

## 2021-10-18 DIAGNOSIS — I7789 Other specified disorders of arteries and arterioles: Secondary | ICD-10-CM

## 2021-10-22 ENCOUNTER — Ambulatory Visit
Admission: RE | Admit: 2021-10-22 | Discharge: 2021-10-22 | Disposition: A | Discharge status: Home / Self Care | Payer: BC Managed Care – PPO | Source: Ambulatory Visit | Attending: Physician Assistant | Admitting: Physician Assistant

## 2021-10-22 DIAGNOSIS — I7789 Other specified disorders of arteries and arterioles: Secondary | ICD-10-CM | POA: Insufficient documentation

## 2021-10-22 DIAGNOSIS — I7121 Aneurysm of the ascending aorta, without rupture: Secondary | ICD-10-CM | POA: Diagnosis not present

## 2021-10-22 MED ORDER — IOHEXOL 350 MG/ML SOLN
75.0000 mL | Freq: Once | INTRAVENOUS | Status: AC | PRN
  Administered 2021-10-22: 75 mL via INTRAVENOUS

## 2021-12-08 DIAGNOSIS — Z96651 Presence of right artificial knee joint: Secondary | ICD-10-CM | POA: Diagnosis not present

## 2022-01-13 ENCOUNTER — Telehealth: Payer: Self-pay

## 2022-01-13 NOTE — Patient Outreach (Signed)
  Care Coordination   Initial Visit Note   01/13/2022 Name: Stanley Whitney MRN: [id number] DOB: 1960-05-04  Stanley Whitney is a 61 y.o. year old male who sees [name], [place] for primary care. I spoke with  Stanley Whitney by phone today.  What matters to the patients health and wellness today?  Placed call to patient today and reviewed [facility] care Coordination program. Patient declines needs at this time.   SDOH assessments and interventions completed:  No\     Care Coordination Interventions Activated:  No  Care Coordination Interventions:  No, not indicated   Follow up plan: No further intervention required.   Encounter Outcome:  Pt. Refused   Tomasa Rand, RN, BSN, CEN [facility] [employer] [phone]

## 2022-01-25 DIAGNOSIS — I10 Essential (primary) hypertension: Secondary | ICD-10-CM | POA: Diagnosis not present

## 2022-01-25 DIAGNOSIS — R7303 Prediabetes: Secondary | ICD-10-CM | POA: Diagnosis not present

## 2022-01-25 DIAGNOSIS — I7789 Other specified disorders of arteries and arterioles: Secondary | ICD-10-CM | POA: Diagnosis not present

## 2022-01-25 DIAGNOSIS — E7800 Pure hypercholesterolemia, unspecified: Secondary | ICD-10-CM | POA: Diagnosis not present

## 2022-02-01 ENCOUNTER — Other Ambulatory Visit: Payer: Self-pay | Admitting: Physician Assistant

## 2022-02-01 DIAGNOSIS — R911 Solitary pulmonary nodule: Secondary | ICD-10-CM

## 2022-02-10 ENCOUNTER — Ambulatory Visit
Admission: RE | Admit: 2022-02-10 | Discharge: 2022-02-10 | Disposition: A | Discharge status: Home / Self Care | Payer: BC Managed Care – PPO | Source: Ambulatory Visit | Attending: Physician Assistant | Admitting: Physician Assistant

## 2022-02-10 DIAGNOSIS — R911 Solitary pulmonary nodule: Secondary | ICD-10-CM | POA: Diagnosis not present

## 2022-06-15 ENCOUNTER — Telehealth: Payer: Self-pay

## 2022-06-15 NOTE — Patient Outreach (Signed)
  Care Coordination   Initial Visit Note   06/15/2022 Name: Stanley Whitney MRN: [id number] DOB: March 10, 1961  Stanley Whitney is a 62 y.o. year old male who sees [name], [place] for primary care. I spoke with  Stanley Whitney by phone today.  What matters to the patients health and wellness today?  Placed call to patient to review and offer [facility] care coordination program.  Patient reports that he is doing well and denies any needs today.      SDOH assessments and interventions completed:  No     Care Coordination Interventions:  No, not indicated   Follow up plan: No further intervention required.   Encounter Outcome:  Pt. Refused   Tomasa Rand, RN, BSN, CEN [facility] [employer] [phone]

## 2022-07-04 ENCOUNTER — Ambulatory Visit: Payer: BC Managed Care – PPO | Admitting: Internal Medicine

## 2022-07-04 NOTE — Progress Notes (Signed)
Pt canceled his appointment.  

## 2022-08-02 DIAGNOSIS — Z1331 Encounter for screening for depression: Secondary | ICD-10-CM | POA: Diagnosis not present

## 2022-08-02 DIAGNOSIS — I10 Essential (primary) hypertension: Secondary | ICD-10-CM | POA: Diagnosis not present

## 2022-08-02 DIAGNOSIS — Z125 Encounter for screening for malignant neoplasm of prostate: Secondary | ICD-10-CM | POA: Diagnosis not present

## 2022-08-02 DIAGNOSIS — R7303 Prediabetes: Secondary | ICD-10-CM | POA: Diagnosis not present

## 2022-08-02 DIAGNOSIS — R911 Solitary pulmonary nodule: Secondary | ICD-10-CM | POA: Diagnosis not present

## 2022-08-02 DIAGNOSIS — E7800 Pure hypercholesterolemia, unspecified: Secondary | ICD-10-CM | POA: Diagnosis not present

## 2022-09-01 DIAGNOSIS — M4812 Ankylosing hyperostosis [Forestier], cervical region: Secondary | ICD-10-CM | POA: Diagnosis not present

## 2022-09-01 DIAGNOSIS — G5603 Carpal tunnel syndrome, bilateral upper limbs: Secondary | ICD-10-CM | POA: Diagnosis not present

## 2022-10-13 ENCOUNTER — Other Ambulatory Visit: Payer: Self-pay

## 2022-10-13 ENCOUNTER — Telehealth: Payer: Self-pay

## 2022-10-13 DIAGNOSIS — Z86010 Personal history of colonic polyps: Secondary | ICD-10-CM

## 2022-10-13 MED ORDER — NA SULFATE-K SULFATE-MG SULF 17.5-3.13-1.6 GM/177ML PO SOLN: 1.0000 | Freq: Once | ORAL | 0 refills | Status: AC

## 2022-10-13 NOTE — Telephone Encounter (Signed)
PT Deanna called to schedule husbands colonoscopy please return call

## 2022-10-13 NOTE — Telephone Encounter (Signed)
Gastroenterology Pre-Procedure Review  Request Date: 03/17/23 Requesting Physician: Dr. Tobi Bastos  PATIENT REVIEW QUESTIONS: The patient responded to the following health history questions as indicated:    1. Are you having any GI issues? yes (hemorrhoids, reflux.  Offered to schedule an office visit but declined) 2. Do you have a personal history of Polyps? yes (last colonoscopy was performed by Dr. Tobi Bastos 03/12/20 recommended repeat in 3 years ) 3. Do you have a family history of Colon Cancer or Polyps? no 4. Diabetes Mellitus? no 5. Joint replacements in the past 12 months?no 6. Major health problems in the past 3 months?no 7. Any artificial heart valves, MVP, or defibrillator?no    MEDICATIONS & ALLERGIES:    Patient reports the following regarding taking any anticoagulation/antiplatelet therapy:   Plavix, Coumadin, Eliquis, Xarelto, Lovenox, Pradaxa, Brilinta, or Effient? no Aspirin? no  Patient confirms/reports the following medications:  Current Outpatient Medications  Medication Sig Dispense Refill   atenolol (TENORMIN) 25 MG tablet Take 25 mg by mouth daily.     atenolol (TENORMIN) 25 MG tablet 1 tablet Orally Once a day     hydrochlorothiazide (HYDRODIURIL) 25 MG tablet Take 25 mg by mouth daily.     hydrochlorothiazide (HYDRODIURIL) 25 MG tablet 1 tablet in the morning Orally Once a day     naproxen (NAPROSYN) 500 MG tablet Take 1 tablet (500 mg total) by mouth 2 (two) times daily with a meal. As needed for pain 20 tablet 0   naproxen sodium (ANAPROX) 550 MG tablet Take 550 mg by mouth 2 (two) times daily.     rosuvastatin (CRESTOR) 10 MG tablet Take 10 mg by mouth daily.     tadalafil (CIALIS) 5 MG tablet Take 1 tablet (5 mg total) by mouth daily. (Patient taking differently: Take 5 mg by mouth daily as needed for erectile dysfunction.) 90 tablet 0   No current facility-administered medications for this visit.    Patient confirms/reports the following allergies:  No Known  Allergies  No orders of the defined types were placed in this encounter.   AUTHORIZATION INFORMATION Primary Insurance: 1D#: Group #:  Secondary Insurance: 1D#: Group #:  SCHEDULE INFORMATION: Date: 03/17/23 Time: Location: [facility]

## 2022-10-14 ENCOUNTER — Telehealth: Payer: Self-pay | Admitting: Gastroenterology

## 2022-10-14 NOTE — Telephone Encounter (Signed)
Pt would like to switch colonoscopy from 03/17/2023 to 03/20/2023 if possible  please return call

## 2022-10-14 NOTE — Telephone Encounter (Signed)
Patient call has been returned.  His colonoscopy has been rescheduled from 03/17/23 to 03/20/23.  Stanley Whitney in Endo has been notified of date change.  Referral and instructions updated.  Thanks, Stanley Whitney CMA

## 2022-10-17 ENCOUNTER — Ambulatory Visit: Payer: BC Managed Care – PPO

## 2022-10-17 VITALS — BP 146/84 | HR 75 | Resp 14 | Ht 68.0 in | Wt 210.0 lb

## 2022-10-17 DIAGNOSIS — Z7189 Other specified counseling: Secondary | ICD-10-CM

## 2022-10-17 DIAGNOSIS — G4733 Obstructive sleep apnea (adult) (pediatric): Secondary | ICD-10-CM

## 2022-10-17 DIAGNOSIS — I10 Essential (primary) hypertension: Secondary | ICD-10-CM

## 2022-10-17 NOTE — Progress Notes (Unsigned)
[facility] [address] [place], [place] [address]  Pulmonary Sleep Medicine   Office Visit Note  Patient Name: Stanley Whitney DOB: July 08, 1960 MRN [id number]    Chief Complaint: Obstructive Sleep Apnea visit  Brief History:  Stanley Whitney is seen today for an annual follow up on CPAP at 12 cmh20.  The patient has a 5 year history of sleep apnea. Patient is using PAP nightly.  The patient feels rested after sleeping with PAP.  The patient reports benefiting from PAP use. Reported sleepiness is  improved and the Epworth Sleepiness Score is 8 out of 24. The patient does not take naps. The patient complains of the following: No complaints.  The compliance download shows 93% compliance with an average use time of 6:04 hours. The AHI is 0.8.  The patient does not complain of limb movements disrupting sleep.  ROS  General: (-) fever, (-) chills, (-) night sweat Nose and Sinuses: (-) nasal stuffiness or itchiness, (-) postnasal drip, (-) nosebleeds, (-) sinus trouble. Mouth and Throat: (-) sore throat, (-) hoarseness. Neck: (-) swollen glands, (-) enlarged thyroid, (-) neck pain. Respiratory: - cough, - shortness of breath, - wheezing. Neurologic: - numbness, - tingling. Psychiatric: - anxiety, - depression   Current Medication: Outpatient Encounter Medications as of 10/17/2022  Medication Sig   atenolol (TENORMIN) 25 MG tablet Take 25 mg by mouth daily.   hydrochlorothiazide (HYDRODIURIL) 25 MG tablet Take 25 mg by mouth daily.   meloxicam (MOBIC) 15 MG tablet TAKE 1 TABLET BY MOUTH ONCE DAILY WITH MEALS   naproxen (NAPROSYN) 500 MG tablet Take 1 tablet (500 mg total) by mouth 2 (two) times daily with a meal. As needed for pain   naproxen sodium (ANAPROX) 550 MG tablet Take 550 mg by mouth 2 (two) times daily.   rosuvastatin (CRESTOR) 10 MG tablet Take 10 mg by mouth daily.   tadalafil (CIALIS) 5 MG tablet Take 1 tablet (5 mg total) by mouth daily. (Patient taking differently:  Take 5 mg by mouth daily as needed for erectile dysfunction.)   No facility-administered encounter medications on file as of 10/17/2022.    Surgical History: Past Surgical History:  Procedure Laterality Date   BLADDER REPAIR N/A 04/21/2017   Procedure: REPAIR BLADDER CYSTOTOMY;  Surgeon: Berna Bue, MD;  Location: [facility] OR;  Service: General;  Laterality: N/A;   COLONOSCOPY WITH PROPOFOL N/A 06/16/2016   Procedure: COLONOSCOPY WITH PROPOFOL;  Surgeon: Wyline Mood, MD;  Location: [facility] ENDOSCOPY;  Service: Endoscopy;  Laterality: N/A;   COLONOSCOPY WITH PROPOFOL N/A 10/04/2016   Procedure: COLONOSCOPY WITH PROPOFOL;  Surgeon: Wyline Mood, MD;  Location: [facility] ENDOSCOPY;  Service: Endoscopy;  Laterality: N/A;   COLONOSCOPY WITH PROPOFOL N/A 03/12/2020   Procedure: COLONOSCOPY WITH PROPOFOL;  Surgeon: Wyline Mood, MD;  Location: [facility] ENDOSCOPY;  Service: Gastroenterology;  Laterality: N/A;   COLONOSCOPY WITH PROPOFOL N/A 03/13/2020   Procedure: COLONOSCOPY WITH PROPOFOL;  Surgeon: Wyline Mood, MD;  Location: [facility] ENDOSCOPY;  Service: Gastroenterology;  Laterality: N/A;   CYST REMOVAL TRUNK     Back   EYE SURGERY     lasix   INGUINAL HERNIA REPAIR Right 04/21/2017   Procedure: OPEN RIGHT INGUINAL HERNIA REPAIR WITH MESH;  Surgeon: Berna Bue, MD;  Location: [facility] OR;  Service: General;  Laterality: Right;   INSERTION OF MESH Right 04/21/2017   Procedure: INSERTION OF MESH; RIGHT INGUINAL;  Surgeon: Berna Bue, MD;  Location: [facility] OR;  Service: General;  Laterality: Right;  none     TOTAL KNEE ARTHROPLASTY      Medical History: Past Medical History:  Diagnosis Date   Bilateral incarcerated inguinal hernia    Family history of breast cancer    Heartburn    History of kidney stones    HLD (hyperlipidemia)    Hypertension    Kidney stone     Family History: Non contributory to the present illness  Social History: Social History   Socioeconomic History   Marital  status: Married    Spouse name: Not on file   Number of children: Not on file   Years of education: Not on file   Highest education level: Not on file  Occupational History   Not on file  Tobacco Use   Smoking status: Former   Smokeless tobacco: Former   Tobacco comments:    quit over 30 years  Vaping Use   Vaping status: Never Used  Substance and Sexual Activity   Alcohol use: Never    Comment: social   Drug use: No   Sexual activity: Not on file  Other Topics Concern   Not on file  Social History Narrative   Not on file   Social Determinants of Health   Financial Resource Strain: Not on file  Food Insecurity: Not on file  Transportation Needs: Not on file  Physical Activity: Not on file  Stress: Not on file  Social Connections: Not on file  Intimate Partner Violence: Not on file    Vital Signs: There were no vitals taken for this visit. There is no height or weight on file to calculate BMI.    Examination: General Appearance: The patient is well-developed, well-nourished, and in no distress. Neck Circumference: 48 cm Skin: Gross inspection of skin unremarkable. Head: normocephalic, no gross deformities. Eyes: no gross deformities noted. ENT: ears appear grossly normal Neurologic: Alert and oriented. No involuntary movements.  STOP BANG RISK ASSESSMENT S (snore) Have you been told that you snore?     NO   T (tired) Are you often tired, fatigued, or sleepy during the day?   NO  O (obstruction) Do you stop breathing, choke, or gasp during sleep? NO   P (pressure) Do you have or are you being treated for high blood pressure? YES   B (BMI) Is your body index greater than 35 kg/m? NO   A (age) Are you 15 years old or older? YES   N (neck) Do you have a neck circumference greater than 16 inches?   YES   G (gender) Are you a male? YES   TOTAL STOP/BANG "YES" ANSWERS 4       A STOP-Bang score of 2 or less is considered low risk, and a score of 5 or more  is high risk for having either moderate or severe OSA. For people who score 3 or 4, doctors may need to perform further assessment to determine how likely they are to have OSA.         EPWORTH SLEEPINESS SCALE:  Scale:  (0)= no chance of dozing; (1)= slight chance of dozing; (2)= moderate chance of dozing; (3)= high chance of dozing  Chance  Situtation    Sitting and reading: 1    Watching TV: 1    Sitting Inactive in public: 0    As a passenger in car: 2      Lying down to rest: 2    Sitting and talking: 0    Sitting quielty after lunch:  2    In a car, stopped in traffic: 0   TOTAL SCORE:   8 out of 24    SLEEP STUDIES:  PSG (07/08/17) AHI 28, min SP02 69%   CPAP COMPLIANCE DATA:  Date Range: 10/17/21 - 10/16/22  Average Daily Use: 6:04 hours  Median Use: 6:19 hours  Compliance for > 4 Hours: 341 days  AHI: 0.8 respiratory events per hour  Days Used: 346/365  Mask Leak: 11.2  95th Percentile Pressure: 12 cmh20         LABS: No results found for this or any previous visit (from the past 2160 hour(s)).  Radiology: CT CHEST WO CONTRAST  Result Date: 02/14/2022 CLINICAL DATA:  Follow-up lung nodule. EXAM: CT CHEST WITHOUT CONTRAST TECHNIQUE: Multidetector CT imaging of the chest was performed following the standard protocol without IV contrast. RADIATION DOSE REDUCTION: This exam was performed according to the departmental dose-optimization program which includes automated exposure control, adjustment of the mA and/or kV according to patient size and/or use of iterative reconstruction technique. COMPARISON:  10/22/2021 FINDINGS: Cardiovascular: Heart size is normal. No pericardial effusion. No significant coronary calcifications. No significant atherosclerotic calcification of the thoracic aorta. No aneurysm. Noncontrast appearance of the pulmonary arteries is unremarkable. Mediastinum/Nodes: Esophagus is normal. The visualized portion of the thyroid  gland has a normal appearance. No significant mediastinal, hilar, or axillary adenopathy. Lungs/Pleura: At the LEFT lung base, irregular solid pulmonary nodule is 1.6 x 1.4 centimeters nodule is associated with scarring in the LEFT LOWER lobe. Previously, this nodule measures 1 7 x 1.7 centimeters. The nodule is stable or slightly smaller compared with prior study 10/21/2020. No new or suspicious pulmonary nodules. No consolidations or pleural effusions. No pulmonary edema. Airways are patent. Upper Abdomen: Gallbladder is present. No acute abnormality. Partially imaged appendix appears normal. Musculoskeletal: Moderate midthoracic spondylosis. No acute abnormality. IMPRESSION: 1. Stable or slightly smaller LEFT LOWER lobe pulmonary nodule, measuring 1.6 centimeters. Findings are consistent with benign process. Recommend 1 additional follow-up in 1 year. 2. No new or suspicious pulmonary nodules. 3. No acute abnormality of the chest. Electronically Signed   By: Norva Pavlov M.D.   On: 02/14/2022 05:53    No results found.  No results found.    Assessment and Plan: Patient Active Problem List   Diagnosis Date Noted   OSA on CPAP 05/11/2020   CPAP use counseling 05/11/2020   Hypertension 05/11/2020   Obesity (BMI 30-39.9) 05/11/2020   Genetic testing 04/14/2020   Family history of breast cancer    Lateral epicondylitis 02/21/2019   Sebaceous cyst 02/21/2019   S/P bladder repair 04/21/2017   Polyp of sigmoid colon    Benign neoplasm of descending colon    Special screening for malignant neoplasms, colon    Benign neoplasm of ascending colon    Screening for colon cancer    Benign neoplasm of cecum    Tear of medial meniscus of knee 02/22/2016   1. OSA on CPAP The patient does tolerate PAP and reports  benefit from PAP use. Machine is at end of life and must be replaced. The patient was reminded how to clean equipment and advised to replace supplies routinely. The patient was also  counselled on weight loss. The compliance is very good. The AHI is 0.8.   OSA on cpap- controlled. Continue with excellent compliance with pap. CPAP continues to be medically necessary to treat this patient's OSA. Replace machine. F/u 30d after setup  2. CPAP use counseling CPAP  Counseling: had a lengthy discussion with the patient regarding the importance of PAP therapy in management of the sleep apnea. Patient appears to understand the risk factor reduction and also understands the risks associated with untreated sleep apnea. Patient will try to make a good faith effort to remain compliant with therapy. Also instructed the patient on proper cleaning of the device including the water must be changed daily if possible and use of distilled water is preferred. Patient understands that the machine should be regularly cleaned with appropriate recommended cleaning solutions that do not damage the PAP machine for example given white vinegar and water rinses. Other methods such as ozone treatment may not be as good as these simple methods to achieve cleaning.   3. Hypertension, unspecified type Hypertension Counseling:   The following hypertensive lifestyle modification were recommended and discussed:  1. Limiting alcohol intake to less than 1 oz/day of ethanol:(24 oz of beer or 8 oz of wine or 2 oz of 100-proof whiskey). 2. Take baby ASA 81 mg daily. 3. Importance of regular aerobic exercise and losing weight. 4. Reduce dietary saturated fat and cholesterol intake for overall cardiovascular health. 5. Maintaining adequate dietary potassium, calcium, and magnesium intake. 6. Regular monitoring of the blood pressure. 7. Reduce sodium intake to less than 100 mmol/day (less than 2.3 gm of sodium or less than 6 gm of sodium choride)       General Counseling: I have discussed the findings of the evaluation and examination with Stanley Whitney.  I have also discussed any further diagnostic evaluation thatmay be  needed or ordered today. Stanley Whitney verbalizes understanding of the findings of todays visit. We also reviewed his medications today and discussed drug interactions and side effects including but not limited excessive drowsiness and altered mental states. We also discussed that there is always a risk not just to him but also people around him. he has been encouraged to call the office with any questions or concerns that should arise related to todays visit.  No orders of the defined types were placed in this encounter.       I have personally obtained a history, examined the patient, evaluated laboratory and imaging results, formulated the assessment and plan and placed orders. This patient was seen today by Emmaline Kluver, PA-C in collaboration with Dr. Freda Munro.   Yevonne Pax, MD [facility] Diplomate ABMS Pulmonary Critical Care Medicine and Sleep Medicine

## 2022-10-17 NOTE — Patient Instructions (Signed)

## 2022-10-27 DIAGNOSIS — G5603 Carpal tunnel syndrome, bilateral upper limbs: Secondary | ICD-10-CM | POA: Diagnosis not present

## 2022-10-31 DIAGNOSIS — G5603 Carpal tunnel syndrome, bilateral upper limbs: Secondary | ICD-10-CM | POA: Diagnosis not present

## 2023-01-17 ENCOUNTER — Telehealth: Payer: Self-pay

## 2023-01-17 NOTE — Telephone Encounter (Signed)
Patient wife already spoke to the nurse.

## 2023-01-17 NOTE — Telephone Encounter (Signed)
Rescheduled procedure to 03/24/23 w/Dr.Anna -new instructions mailed to patient. Spoke with Rosann Auerbach.

## 2023-02-09 DIAGNOSIS — G5603 Carpal tunnel syndrome, bilateral upper limbs: Secondary | ICD-10-CM | POA: Diagnosis not present

## 2023-02-14 DIAGNOSIS — G4733 Obstructive sleep apnea (adult) (pediatric): Secondary | ICD-10-CM | POA: Diagnosis not present

## 2023-03-23 ENCOUNTER — Encounter: Payer: Self-pay | Admitting: Gastroenterology

## 2023-03-24 ENCOUNTER — Encounter
Admission: RE | Disposition: A | Discharge status: Home / Self Care | Payer: Self-pay | Source: Home / Self Care | Attending: Gastroenterology

## 2023-03-24 ENCOUNTER — Ambulatory Visit: Payer: BC Managed Care – PPO | Admitting: Anesthesiology

## 2023-03-24 ENCOUNTER — Encounter: Payer: Self-pay | Admitting: Gastroenterology

## 2023-03-24 ENCOUNTER — Ambulatory Visit
Admission: RE | Admit: 2023-03-24 | Discharge: 2023-03-24 | Disposition: A | Discharge status: Home / Self Care | Payer: BC Managed Care – PPO | Attending: Gastroenterology | Admitting: Gastroenterology

## 2023-03-24 DIAGNOSIS — D124 Benign neoplasm of descending colon: Secondary | ICD-10-CM | POA: Diagnosis not present

## 2023-03-24 DIAGNOSIS — I10 Essential (primary) hypertension: Secondary | ICD-10-CM | POA: Diagnosis not present

## 2023-03-24 DIAGNOSIS — Z860100 Personal history of colon polyps, unspecified: Secondary | ICD-10-CM | POA: Diagnosis not present

## 2023-03-24 DIAGNOSIS — D126 Benign neoplasm of colon, unspecified: Secondary | ICD-10-CM

## 2023-03-24 DIAGNOSIS — Z1211 Encounter for screening for malignant neoplasm of colon: Secondary | ICD-10-CM | POA: Diagnosis not present

## 2023-03-24 DIAGNOSIS — K635 Polyp of colon: Secondary | ICD-10-CM | POA: Diagnosis not present

## 2023-03-24 DIAGNOSIS — Z09 Encounter for follow-up examination after completed treatment for conditions other than malignant neoplasm: Secondary | ICD-10-CM | POA: Insufficient documentation

## 2023-03-24 DIAGNOSIS — Z87891 Personal history of nicotine dependence: Secondary | ICD-10-CM | POA: Diagnosis not present

## 2023-03-24 HISTORY — PX: COLONOSCOPY WITH PROPOFOL: SHX5780

## 2023-03-24 HISTORY — PX: POLYPECTOMY: SHX5525

## 2023-03-24 SURGERY — COLONOSCOPY WITH PROPOFOL
Anesthesia: General

## 2023-03-24 MED ORDER — SODIUM CHLORIDE 0.9 % IV SOLN: INTRAVENOUS | Status: DC

## 2023-03-24 MED ORDER — PROPOFOL 500 MG/50ML IV EMUL
INTRAVENOUS | Status: DC | PRN
  Administered 2023-03-24: 140 ug/kg/min via INTRAVENOUS

## 2023-03-24 MED ORDER — PROPOFOL 10 MG/ML IV BOLUS
INTRAVENOUS | Status: DC | PRN
  Administered 2023-03-24: 70 mg via INTRAVENOUS

## 2023-03-24 NOTE — Anesthesia Postprocedure Evaluation (Signed)
Anesthesia Post Note  Patient: Stanley Whitney  Procedure(s) Performed: COLONOSCOPY WITH PROPOFOL POLYPECTOMY  Patient location during evaluation: Endoscopy Anesthesia Type: General Level of consciousness: awake and alert Pain management: pain level controlled Vital Signs Assessment: post-procedure vital signs reviewed and stable Respiratory status: spontaneous breathing, nonlabored ventilation, respiratory function stable and patient connected to nasal cannula oxygen Cardiovascular status: blood pressure returned to baseline and stable Postop Assessment: no apparent nausea or vomiting Anesthetic complications: no   No notable events documented.   Last Vitals:  Vitals:   03/24/23 1011 03/24/23 1021  BP: 128/80 131/88  Pulse: 74 (!) 55  Resp: 13 11  Temp: (!) 35.9 C   SpO2: 97% 99%    Last Pain:  Vitals:   03/24/23 1021  TempSrc:   PainSc: 0-No pain                 Louie Boston

## 2023-03-24 NOTE — H&P (Signed)
Wyline Mood, MD [address], [place], [place], [address] [address], [address], [place], [place], [address] Phone: [phone]  Fax: [phone]  Primary Care Physician:  Lonie Peak, PA-C   Pre-Procedure History & Physical: HPI:  Stanley Whitney is a 61 y.o. male is here for an colonoscopy.   Past Medical History:  Diagnosis Date   Bilateral incarcerated inguinal hernia    Family history of breast cancer    Heartburn    History of kidney stones    HLD (hyperlipidemia)    Hypertension     Past Surgical History:  Procedure Laterality Date   BLADDER REPAIR N/A 04/21/2017   Procedure: REPAIR BLADDER CYSTOTOMY;  Surgeon: Berna Bue, MD;  Location: [facility] OR;  Service: General;  Laterality: N/A;   COLONOSCOPY WITH PROPOFOL N/A 06/16/2016   Procedure: COLONOSCOPY WITH PROPOFOL;  Surgeon: Wyline Mood, MD;  Location: [facility] ENDOSCOPY;  Service: Endoscopy;  Laterality: N/A;   COLONOSCOPY WITH PROPOFOL N/A 10/04/2016   Procedure: COLONOSCOPY WITH PROPOFOL;  Surgeon: Wyline Mood, MD;  Location: [facility] ENDOSCOPY;  Service: Endoscopy;  Laterality: N/A;   COLONOSCOPY WITH PROPOFOL N/A 03/12/2020   Procedure: COLONOSCOPY WITH PROPOFOL;  Surgeon: Wyline Mood, MD;  Location: [facility] ENDOSCOPY;  Service: Gastroenterology;  Laterality: N/A;   COLONOSCOPY WITH PROPOFOL N/A 03/13/2020   Procedure: COLONOSCOPY WITH PROPOFOL;  Surgeon: Wyline Mood, MD;  Location: [facility] ENDOSCOPY;  Service: Gastroenterology;  Laterality: N/A;   CYST REMOVAL TRUNK     Back   EYE SURGERY     lasix   INGUINAL HERNIA REPAIR Right 04/21/2017   Procedure: OPEN RIGHT INGUINAL HERNIA REPAIR WITH MESH;  Surgeon: Berna Bue, MD;  Location: [facility] OR;  Service: General;  Laterality: Right;   INSERTION OF MESH Right 04/21/2017   Procedure: INSERTION OF MESH; RIGHT INGUINAL;  Surgeon: Berna Bue, MD;  Location: [facility] OR;  Service: General;  Laterality: Right;   JOINT REPLACEMENT     TOTAL KNEE ARTHROPLASTY       Prior to Admission medications   Medication Sig Start Date End Date Taking? Authorizing Provider  atenolol (TENORMIN) 25 MG tablet Take 25 mg by mouth daily.   Yes [provider]  rosuvastatin (CRESTOR) 10 MG tablet Take 10 mg by mouth daily.   Yes [provider]  hydrochlorothiazide (HYDRODIURIL) 25 MG tablet Take 25 mg by mouth daily.    [provider]  naproxen (NAPROSYN) 500 MG tablet Take 1 tablet (500 mg total) by mouth 2 (two) times daily with a meal. As needed for pain 10/21/20   Linwood Dibbles, MD  naproxen sodium (ANAPROX) 550 MG tablet Take 550 mg by mouth 2 (two) times daily. 09/21/20   [provider]  tadalafil (CIALIS) 5 MG tablet Take 1 tablet (5 mg total) by mouth daily. Patient taking differently: Take 5 mg by mouth daily as needed for erectile dysfunction. 03/18/20   Riki Altes, MD    Allergies as of 10/13/2022   (No Known Allergies)    Family History  Problem Relation Age of Onset   Breast cancer Mother        dx 57s   Cancer Brother 61       unk type   Other Sister        brain tumor d. 29   Kidney disease Neg Hx    Prostate cancer Neg Hx     Social History   Socioeconomic History   Marital status: Married  Spouse name: Not on file   Number of children: Not on file   Years of education: Not on file   Highest education level: Not on file  Occupational History   Not on file  Tobacco Use   Smoking status: Former   Smokeless tobacco: Former   Tobacco comments:    quit over 30 years  Vaping Use   Vaping status: Never Used  Substance and Sexual Activity   Alcohol use: Never    Comment: social   Drug use: No   Sexual activity: Not on file  Other Topics Concern   Not on file  Social History Narrative   Not on file   Social Drivers of Health   Financial Resource Strain: Not on file  Food Insecurity: Not on file  Transportation Needs: Not on file  Physical Activity: Not on file  Stress: Not on file   Social Connections: Not on file  Intimate Partner Violence: Not on file    Review of Systems: See HPI, otherwise negative ROS  Physical Exam: BP (!) 142/91   Pulse 70   Temp (!) 96.3 F (35.7 C) (Temporal)   Resp 17   Ht 5\' 8"  (1.727 m)   Wt 90.7 kg   SpO2 98%   BMI 30.41 kg/m  General:   Alert,  pleasant and cooperative in NAD Head:  Normocephalic and atraumatic. Neck:  Supple; no masses or thyromegaly. Lungs:  Clear throughout to auscultation, normal respiratory effort.    Heart:  +S1, +S2, Regular rate and rhythm, No edema. Abdomen:  Soft, nontender and nondistended. Normal bowel sounds, without guarding, and without rebound.   Neurologic:  Alert and  oriented x4;  grossly normal neurologically.  Impression/Plan: Stanley Whitney is here for an colonoscopy to be performed for surveillance due to prior history of colon polyps   Risks, benefits, limitations, and alternatives regarding  colonoscopy have been reviewed with the patient.  Questions have been answered.  All parties agreeable.   Wyline Mood, MD  03/24/2023, 9:06 AM

## 2023-03-24 NOTE — Op Note (Signed)
[facility] Gastroenterology Patient Name: Stanley Whitney Procedure Date: 03/24/2023 9:49 AM MRN: [id number] Account #: [id number] Date of Birth: 1960-12-29 Admit Type: Outpatient Age: 62 Room: [facility] ENDO ROOM 4 Gender: Male Note Status: Finalized Instrument Name: Prentice Docker [id number] Procedure:             Colonoscopy Indications:           Surveillance: Personal history of adenomatous polyps                         on last colonoscopy > 3 years ago, Last colonoscopy:                         December 2021 Providers:             Wyline Mood MD, MD Referring MD:          No Local Md, MD (Referring MD) Medicines:             Monitored Anesthesia Care Complications:         No immediate complications. Procedure:             Pre-Anesthesia Assessment:                        - Prior to the procedure, a History and Physical was                         performed, and patient medications, allergies and                         sensitivities were reviewed. The patient's tolerance                         of previous anesthesia was reviewed.                        - The risks and benefits of the procedure and the                         sedation options and risks were discussed with the                         patient. All questions were answered and informed                         consent was obtained.                        - ASA Grade Assessment: II - A patient with mild                         systemic disease.                        After obtaining informed consent, the colonoscope was                         passed under direct vision. Throughout the procedure,                         the patient's  blood pressure, pulse, and oxygen                         saturations were monitored continuously. The                         Colonoscope was introduced through the anus and                         advanced to the the cecum, identified by the                          appendiceal orifice. The colonoscopy was performed                         with ease. The patient tolerated the procedure well.                         The quality of the bowel preparation was excellent.                         The ileocecal valve, appendiceal orifice, and rectum                         were photographed. Findings:      The perianal and digital rectal examinations were normal.      A 5 mm polyp was found in the descending colon. The polyp was sessile.       The polyp was removed with a cold snare. Resection and retrieval were       complete.      A 3 mm polyp was found in the descending colon. The polyp was sessile.       The polyp was removed with a cold biopsy forceps. Resection and       retrieval were complete.      The exam was otherwise without abnormality on direct and retroflexion       views. Impression:            - One 5 mm polyp in the descending colon, removed with                         a cold snare. Resected and retrieved.                        - One 3 mm polyp in the descending colon, removed with                         a cold biopsy forceps. Resected and retrieved.                        - The examination was otherwise normal on direct and                         retroflexion views. Recommendation:        - Discharge patient to home (with escort).                        - Resume previous diet.                        -  Continue present medications.                        - Await pathology results.                        - Repeat colonoscopy in 5 years for surveillance. Procedure Code(s):     --- Professional ---                        [phone], Colonoscopy, flexible; with removal of                         tumor(s), polyp(s), or other lesion(s) by snare                         technique                        [id number], 59, Colonoscopy, flexible; with biopsy, single                         or multiple Diagnosis Code(s):     --- Professional ---                         Z86.010, Personal history of colonic polyps                        D12.4, Benign neoplasm of descending colon CPT copyright 2022 American Medical Association. All rights reserved. The codes documented in this report are preliminary and upon coder review may  be revised to meet current compliance requirements. Wyline Mood, MD Wyline Mood MD, MD 03/24/2023 10:10:05 AM This report has been signed electronically. Number of Addenda: 0 Note Initiated On: 03/24/2023 9:49 AM Scope Withdrawal Time: 0 hours 12 minutes 28 seconds  Total Procedure Duration: 0 hours 15 minutes 18 seconds  Estimated Blood Loss:  Estimated blood loss: none.      [facility]

## 2023-03-24 NOTE — Anesthesia Preprocedure Evaluation (Signed)
Anesthesia Evaluation  Patient identified by MRN, date of birth, ID band Patient awake    Reviewed: Allergy & Precautions, NPO status , Patient's Chart, lab work & pertinent test results  History of Anesthesia Complications Negative for: history of anesthetic complications  Airway Mallampati: III  TM Distance: >3 FB Neck ROM: full    Dental no notable dental hx.    Pulmonary sleep apnea , former smoker   Pulmonary exam normal        Cardiovascular hypertension, On Medications negative cardio ROS Normal cardiovascular exam     Neuro/Psych negative neurological ROS  negative psych ROS   GI/Hepatic negative GI ROS, Neg liver ROS,,,  Endo/Other  negative endocrine ROS    Renal/GU negative Renal ROS  negative genitourinary   Musculoskeletal   Abdominal   Peds  Hematology negative hematology ROS (+)   Anesthesia Other Findings Past Medical History: No date: Bilateral incarcerated inguinal hernia No date: Family history of breast cancer No date: Heartburn No date: History of kidney stones No date: HLD (hyperlipidemia) No date: Hypertension  Past Surgical History: 04/21/2017: BLADDER REPAIR; N/A     Comment:  Procedure: REPAIR BLADDER CYSTOTOMY;  Surgeon: Berna Bue, MD;  Location: [facility] OR;  Service: General;                Laterality: N/A; 06/16/2016: COLONOSCOPY WITH PROPOFOL; N/A     Comment:  Procedure: COLONOSCOPY WITH PROPOFOL;  Surgeon: Wyline Mood, MD;  Location: [facility] ENDOSCOPY;  Service: Endoscopy;              Laterality: N/A; 10/04/2016: COLONOSCOPY WITH PROPOFOL; N/A     Comment:  Procedure: COLONOSCOPY WITH PROPOFOL;  Surgeon: Wyline Mood, MD;  Location: [facility] ENDOSCOPY;  Service:               Endoscopy;  Laterality: N/A; 03/12/2020: COLONOSCOPY WITH PROPOFOL; N/A     Comment:  Procedure: COLONOSCOPY WITH PROPOFOL;  Surgeon: Wyline Mood,  MD;  Location: [facility] ENDOSCOPY;  Service:               Gastroenterology;  Laterality: N/A; 03/13/2020: COLONOSCOPY WITH PROPOFOL; N/A     Comment:  Procedure: COLONOSCOPY WITH PROPOFOL;  Surgeon: Wyline Mood, MD;  Location: [facility] ENDOSCOPY;  Service:               Gastroenterology;  Laterality: N/A; No date: CYST REMOVAL TRUNK     Comment:  Back No date: EYE SURGERY     Comment:  lasix 04/21/2017: INGUINAL HERNIA REPAIR; Right     Comment:  Procedure: OPEN RIGHT INGUINAL HERNIA REPAIR WITH MESH;               Surgeon: Berna Bue, MD;  Location: [facility] OR;                Service: General;  Laterality: Right; 04/21/2017: INSERTION OF MESH; Right     Comment:  Procedure: INSERTION OF MESH; RIGHT INGUINAL;  Surgeon:               Berna Bue, MD;  Location:  [facility] OR;  Service:               General;  Laterality: Right; No date: JOINT REPLACEMENT No date: TOTAL KNEE ARTHROPLASTY  BMI    Body Mass Index: 30.41 kg/m      Reproductive/Obstetrics negative OB ROS                             Anesthesia Physical Anesthesia Plan  ASA: 2  Anesthesia Plan: General   Post-op Pain Management: Minimal or no pain anticipated   Induction: Intravenous  PONV Risk Score and Plan: 1 and Propofol infusion and TIVA  Airway Management Planned: Natural Airway and Nasal Cannula  Additional Equipment:   Intra-op Plan:   Post-operative Plan:   Informed Consent: I have reviewed the patients History and Physical, chart, labs and discussed the procedure including the risks, benefits and alternatives for the proposed anesthesia with the patient or authorized representative who has indicated his/her understanding and acceptance.     Dental Advisory Given  Plan Discussed with: Anesthesiologist, CRNA and Surgeon  Anesthesia Plan Comments: (Patient consented for risks of anesthesia including but not limited to:  - adverse reactions to medications - risk  of airway placement if required - damage to eyes, teeth, lips or other oral mucosa - nerve damage due to positioning  - sore throat or hoarseness - Damage to heart, brain, nerves, lungs, other parts of body or loss of life  Patient voiced understanding and assent.)       Anesthesia Quick Evaluation

## 2023-03-24 NOTE — Transfer of Care (Signed)
Immediate Anesthesia Transfer of Care Note  Patient: Stanley Whitney  Procedure(s) Performed: COLONOSCOPY WITH PROPOFOL POLYPECTOMY  Patient Location: PACU  Anesthesia Type:General  Level of Consciousness: awake, alert , and oriented  Airway & Oxygen Therapy: Patient Spontanous Breathing  Post-op Assessment: Report given to RN and Post -op Vital signs reviewed and stable  Post vital signs: Reviewed and stable  Last Vitals:  Vitals Value Taken Time  BP 128/80 03/24/23 1011  Temp    Pulse 74 03/24/23 1011  Resp 13 03/24/23 1011  SpO2 97 % 03/24/23 1011    Last Pain:  Vitals:   03/24/23 1011  TempSrc:   PainSc: Asleep         Complications: No notable events documented.

## 2023-03-27 LAB — SURGICAL PATHOLOGY

## 2023-03-31 ENCOUNTER — Encounter: Payer: Self-pay | Admitting: Gastroenterology
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
## 2023-04-20 DIAGNOSIS — G4733 Obstructive sleep apnea (adult) (pediatric): Secondary | ICD-10-CM | POA: Diagnosis not present

## 2023-04-20 DIAGNOSIS — I10 Essential (primary) hypertension: Secondary | ICD-10-CM | POA: Diagnosis not present

## 2023-04-20 DIAGNOSIS — R7303 Prediabetes: Secondary | ICD-10-CM | POA: Diagnosis not present

## 2023-04-20 DIAGNOSIS — E7800 Pure hypercholesterolemia, unspecified: Secondary | ICD-10-CM | POA: Diagnosis not present

## 2023-05-02 ENCOUNTER — Other Ambulatory Visit: Payer: Self-pay | Admitting: Physician Assistant

## 2023-05-02 DIAGNOSIS — I7789 Other specified disorders of arteries and arterioles: Secondary | ICD-10-CM

## 2023-05-11 ENCOUNTER — Ambulatory Visit
Admission: RE | Admit: 2023-05-11 | Discharge: 2023-05-11 | Disposition: A | Discharge status: Home / Self Care | Payer: BC Managed Care – PPO | Source: Ambulatory Visit | Attending: Physician Assistant | Admitting: Physician Assistant

## 2023-05-11 DIAGNOSIS — I7121 Aneurysm of the ascending aorta, without rupture: Secondary | ICD-10-CM | POA: Diagnosis not present

## 2023-05-11 DIAGNOSIS — I7789 Other specified disorders of arteries and arterioles: Secondary | ICD-10-CM | POA: Insufficient documentation

## 2023-05-11 MED ORDER — IOHEXOL 350 MG/ML SOLN
100.0000 mL | Freq: Once | INTRAVENOUS | Status: AC | PRN
  Administered 2023-05-11: 100 mL via INTRAVENOUS

## 2023-10-07 ENCOUNTER — Emergency Department
Admission: EM | Admit: 2023-10-07 | Discharge: 2023-10-07 | Disposition: A | Discharge status: Home / Self Care | Attending: Emergency Medicine | Admitting: Emergency Medicine

## 2023-10-07 ENCOUNTER — Emergency Department

## 2023-10-07 ENCOUNTER — Other Ambulatory Visit: Payer: Self-pay

## 2023-10-07 DIAGNOSIS — N132 Hydronephrosis with renal and ureteral calculous obstruction: Secondary | ICD-10-CM | POA: Insufficient documentation

## 2023-10-07 DIAGNOSIS — R1032 Left lower quadrant pain: Secondary | ICD-10-CM | POA: Diagnosis present

## 2023-10-07 DIAGNOSIS — I10 Essential (primary) hypertension: Secondary | ICD-10-CM | POA: Diagnosis not present

## 2023-10-07 DIAGNOSIS — N23 Unspecified renal colic: Secondary | ICD-10-CM

## 2023-10-07 DIAGNOSIS — N200 Calculus of kidney: Secondary | ICD-10-CM

## 2023-10-07 LAB — COMPREHENSIVE METABOLIC PANEL WITH GFR
ALT: 47 U/L — ABNORMAL HIGH (ref 0–44)
AST: 36 U/L (ref 15–41)
Albumin: 4.3 g/dL (ref 3.5–5.0)
Alkaline Phosphatase: 56 U/L (ref 38–126)
Anion gap: 11 (ref 5–15)
BUN: 19 mg/dL (ref 6–20)
CO2: 26 mmol/L (ref 22–32)
Calcium: 9.7 mg/dL (ref 8.9–10.3)
Chloride: 100 mmol/L (ref 98–111)
Creatinine, Ser: 1.49 mg/dL — ABNORMAL HIGH (ref 0.61–1.24)
GFR, Estimated: 53 mL/min — ABNORMAL LOW (ref >60)
Glucose, Bld: 120 mg/dL — ABNORMAL HIGH (ref 70–99)
Potassium: 3.8 mmol/L (ref 3.5–5.1)
Sodium: 137 mmol/L (ref 135–145)
Total Bilirubin: 0.9 mg/dL (ref 0.0–1.2)
Total Protein: 7.9 g/dL (ref 6.5–8.1)

## 2023-10-07 LAB — URINALYSIS, ROUTINE W REFLEX MICROSCOPIC
Bacteria, UA: NONE SEEN
Bilirubin Urine: NEGATIVE
Glucose, UA: NEGATIVE mg/dL
Ketones, ur: NEGATIVE mg/dL
Leukocytes,Ua: NEGATIVE
Nitrite: NEGATIVE
Protein, ur: NEGATIVE mg/dL
Specific Gravity, Urine: 1.015 (ref 1.005–1.030)
pH: 6 (ref 5.0–8.0)

## 2023-10-07 LAB — CBC WITH DIFFERENTIAL/PLATELET
Abs Immature Granulocytes: 0.05 K/uL (ref 0.00–0.07)
Basophils Absolute: 0.1 K/uL (ref 0.0–0.1)
Basophils Relative: 0 %
Eosinophils Absolute: 0.1 K/uL (ref 0.0–0.5)
Eosinophils Relative: 1 %
HCT: 45.2 % (ref 39.0–52.0)
Hemoglobin: 15.6 g/dL (ref 13.0–17.0)
Immature Granulocytes: 0 %
Lymphocytes Relative: 14 %
Lymphs Abs: 1.9 K/uL (ref 0.7–4.0)
MCH: 31.8 pg (ref 26.0–34.0)
MCHC: 34.5 g/dL (ref 30.0–36.0)
MCV: 92.2 fL (ref 80.0–100.0)
Monocytes Absolute: 1.2 K/uL — ABNORMAL HIGH (ref 0.1–1.0)
Monocytes Relative: 9 %
Neutro Abs: 10.4 K/uL — ABNORMAL HIGH (ref 1.7–7.7)
Neutrophils Relative %: 76 %
Platelets: 249 K/uL (ref 150–400)
RBC: 4.9 MIL/uL (ref 4.22–5.81)
RDW: 13.5 % (ref 11.5–15.5)
WBC: 13.7 K/uL — ABNORMAL HIGH (ref 4.0–10.5)
nRBC: 0 % (ref 0.0–0.2)

## 2023-10-07 MED ORDER — KETOROLAC TROMETHAMINE 15 MG/ML IJ SOLN
15.0000 mg | Freq: Once | INTRAMUSCULAR | Status: AC
  Administered 2023-10-07: 15 mg via INTRAVENOUS
  Filled 2023-10-07: qty 1

## 2023-10-07 MED ORDER — SODIUM CHLORIDE 0.9 % IV BOLUS
1000.0000 mL | Freq: Once | INTRAVENOUS | Status: AC
  Administered 2023-10-07: 1000 mL via INTRAVENOUS

## 2023-10-07 MED ORDER — NAPROXEN 500 MG PO TABS: 500.0000 mg | ORAL_TABLET | Freq: Two times a day (BID) | ORAL | 0 refills | Status: AC

## 2023-10-07 MED ORDER — ONDANSETRON 4 MG PO TBDP
4.0000 mg | ORAL_TABLET | Freq: Three times a day (TID) | ORAL | 0 refills | Status: AC | PRN
Start: 2023-10-07 — End: ?

## 2023-10-07 NOTE — Discharge Instructions (Addendum)
 You are found to have a 2 mm kidney stone on your left side.  Please return if you develop any burning with urination, fevers, chills, worsening pain, vomiting, or any other concerns.  Please follow-up with urology if symptoms persist.  You may take the medications as prescribed to help with your symptoms.  Please stay hydrated.  It was a pleasure caring for you today.

## 2023-10-07 NOTE — ED Triage Notes (Signed)
 States having pain since Thurs that has come and gone and today got worst. Denies dsyuria but is having nauses. Had kidney stones in the pass and states this feels like it.

## 2023-10-07 NOTE — ED Provider Notes (Signed)
 [facility] Provider Note    Event Date/Time   First MD Initiated Contact with Patient 10/07/23 1427     (approximate)   History   Flank Pain (States having pain since Thurs that has come and gone and today got worst. Denies dsyuria but is having nauses. Had kidney stones in the pass and states this feels like it. )   HPI  Stanley Whitney is a 63 y.o. male with a past medical history of kidney stones who presents today for evaluation left-sided flank pain that radiates into his left lower abdomen.  Patient reports this began 2 days ago.  He reports that he has had kidney stones in the past.  He has not noticed any dysuria or hematuria, he feels slightly nauseated has not had any vomiting.  He denies chest pain or shortness of breath.  No fevers or chills.  Patient Active Problem List   Diagnosis Date Noted   History of colonic polyps 03/24/2023   Adenomatous polyp of colon 03/24/2023   OSA on CPAP 05/11/2020   CPAP use counseling 05/11/2020   Hypertension 05/11/2020   Obesity (BMI 30-39.9) 05/11/2020   Genetic testing 04/14/2020   Family history of breast cancer    Lateral epicondylitis 02/21/2019   Sebaceous cyst 02/21/2019   S/P bladder repair 04/21/2017   Polyp of sigmoid colon    Benign neoplasm of descending colon    Special screening for malignant neoplasms, colon    Benign neoplasm of ascending colon    Screening for colon cancer    Benign neoplasm of cecum    Tear of medial meniscus of knee 02/22/2016          Physical Exam   Triage Vital Signs: ED Triage Vitals  Encounter Vitals Group     BP 10/07/23 1358 (!) 163/93     Girls Systolic BP Percentile --      Girls Diastolic BP Percentile --      Boys Systolic BP Percentile --      Boys Diastolic BP Percentile --      Pulse Rate 10/07/23 1358 64     Resp 10/07/23 1358 18     Temp 10/07/23 1358 98.5 F (36.9 C)     Temp Source 10/07/23 1358 Oral     SpO2 10/07/23 1358 97 %      Weight --      Height --      Head Circumference --      Peak Flow --      Pain Score 10/07/23 1356 10     Pain Loc --      Pain Education --      Exclude from Growth Chart --     Most recent vital signs: Vitals:   10/07/23 1358 10/07/23 1427  BP: (!) 163/93   Pulse: 64   Resp: 18   Temp: 98.5 F (36.9 C)   SpO2: 97% 97%    Physical Exam Vitals and nursing note reviewed.  Constitutional:      General: Awake and alert. No acute distress.    Appearance: Normal appearance. The patient is normal weight.  HENT:     Head: Normocephalic and atraumatic.     Mouth: Mucous membranes are moist.  Eyes:     General: PERRL. Normal EOMs        Right eye: No discharge.        Left eye: No discharge.     Conjunctiva/sclera: Conjunctivae normal.  Cardiovascular:     Rate and Rhythm: Normal rate and regular rhythm.     Pulses: Normal pulses.  Pulmonary:     Effort: Pulmonary effort is normal. No respiratory distress.     Breath sounds: Normal breath sounds.  Abdominal:     Abdomen is soft. There is mild left lower quadrant abdominal tenderness. No rebound or guarding. No distention.  No CVA tenderness Musculoskeletal:        General: No swelling. Normal range of motion.     Cervical back: Normal range of motion and neck supple.  Skin:    General: Skin is warm and dry.     Capillary Refill: Capillary refill takes less than 2 seconds.     Findings: No rash.  Neurological:     Mental Status: The patient is awake and alert.      ED Results / Procedures / Treatments   Labs (all labs ordered are listed, but only abnormal results are displayed) Labs Reviewed  CBC WITH DIFFERENTIAL/PLATELET - Abnormal; Notable for the following components:      Result Value   WBC 13.7 (*)    Neutro Abs 10.4 (*)    Monocytes Absolute 1.2 (*)    All other components within normal limits  COMPREHENSIVE METABOLIC PANEL WITH GFR - Abnormal; Notable for the following components:   Glucose, Bld 120  (*)    Creatinine, Ser 1.49 (*)    ALT 47 (*)    GFR, Estimated 53 (*)    All other components within normal limits  URINALYSIS, ROUTINE W REFLEX MICROSCOPIC - Abnormal; Notable for the following components:   Color, Urine YELLOW (*)    APPearance CLEAR (*)    Hgb urine dipstick SMALL (*)    All other components within normal limits     EKG     RADIOLOGY I independently reviewed and interpreted imaging and agree with radiologists findings.     PROCEDURES:  Critical Care performed:   Procedures   MEDICATIONS ORDERED IN ED: Medications  ketorolac  (TORADOL ) 15 MG/ML injection 15 mg (15 mg Intravenous Given 10/07/23 1539)  sodium chloride  0.9 % bolus 1,000 mL (0 mLs Intravenous Stopped 10/07/23 1650)     IMPRESSION / MDM / ASSESSMENT AND PLAN / ED COURSE  I reviewed the triage vital signs and the nursing notes.   Differential diagnosis includes, but is not limited to, nephrolithiasis, ureteral colic, UTI, pyelonephritis, less likely AAA  I reviewed the patient's chart.  Patient has a known 4.1 cm ascending thoracic aneurysm which has been unchanged for the past 4 years.  Patient is awake and alert, hemodynamically stable and afebrile.  He has a history of kidney stones and this feels the same.  There is no pulsatile abdominal mass, no hemodynamic instability.  Labs obtained in triage revealing mild leukocytosis to 13.7 and a mild AKI, though his GFR remains greater than 60.  Urinalysis reveals hemoglobin, no leukocytes or nitrites.  CT renal study obtained given his history of kidney stones and the fact that this feels similar and does reveal a 2 mm proximal ureteral stone on his left side.  His urine is not infected, he is tolerating p.o., and his pain is controlled.  This is within the size range that he should be able to pass this on his own.  Discussed this with the patient.  Feel that this is appropriate for outpatient follow-up as patient is in agreement with.  He feels  comfortable with outpatient follow-up and discharge  home at this time.  He was given the appropriate follow-up information for urology, though we did discuss strict return precautions to the emergency department.  Patient understands and agrees with plan.  He was discharged in stable condition with his wife.   Patient's presentation is most consistent with acute presentation with potential threat to life or bodily function.      FINAL CLINICAL IMPRESSION(S) / ED DIAGNOSES   Final diagnoses:  Ureteral colic  Kidney stone     Rx / DC Orders   ED Discharge Orders          Ordered    naproxen  (NAPROSYN ) 500 MG tablet  2 times daily with meals        10/07/23 1539    ondansetron  (ZOFRAN -ODT) 4 MG disintegrating tablet  Every 8 hours PRN        10/07/23 1539             Note:  This document was prepared using Dragon voice recognition software and may include unintentional dictation errors.   Kaito Schulenburg E, PA-C 10/07/23 1809    Malvina Alm DASEN, MD 10/07/23 [phone]

## 2023-10-10 ENCOUNTER — Ambulatory Visit: Payer: Self-pay | Admitting: Podiatry

## 2024-05-09 ENCOUNTER — Other Ambulatory Visit: Payer: Self-pay | Admitting: Physician Assistant

## 2024-05-09 DIAGNOSIS — I7789 Other specified disorders of arteries and arterioles: Secondary | ICD-10-CM

## 2024-05-17 ENCOUNTER — Ambulatory Visit: Payer: PRIVATE HEALTH INSURANCE
